# Patient Record
Sex: Female | Born: 1955 | Race: White | Hispanic: No | Marital: Single | Smoking: Current every day smoker
Health system: Southern US, Community
[De-identification: ages and names within clinical notes are randomized; demographics above are authoritative.]

## PROBLEM LIST (undated history)

## (undated) DIAGNOSIS — I509 Heart failure, unspecified: Secondary | ICD-10-CM

## (undated) DIAGNOSIS — J449 Chronic obstructive pulmonary disease, unspecified: Secondary | ICD-10-CM

## (undated) DIAGNOSIS — I10 Essential (primary) hypertension: Secondary | ICD-10-CM

## (undated) HISTORY — DX: Chronic obstructive pulmonary disease, unspecified: J44.9

## (undated) HISTORY — PX: STOMACH SURGERY: SHX791

## (undated) HISTORY — PX: APPENDECTOMY: SHX54

## (undated) HISTORY — DX: Heart failure, unspecified: I50.9

## (undated) HISTORY — DX: Essential (primary) hypertension: I10

---

## 2019-05-22 DIAGNOSIS — I340 Nonrheumatic mitral (valve) insufficiency: Secondary | ICD-10-CM

## 2019-05-22 DIAGNOSIS — I10 Essential (primary) hypertension: Secondary | ICD-10-CM

## 2019-05-22 DIAGNOSIS — I361 Nonrheumatic tricuspid (valve) insufficiency: Secondary | ICD-10-CM

## 2019-05-23 DIAGNOSIS — I10 Essential (primary) hypertension: Secondary | ICD-10-CM

## 2019-05-23 DIAGNOSIS — K255 Chronic or unspecified gastric ulcer with perforation: Secondary | ICD-10-CM

## 2019-05-23 DIAGNOSIS — I429 Cardiomyopathy, unspecified: Secondary | ICD-10-CM

## 2019-05-23 DIAGNOSIS — R7989 Other specified abnormal findings of blood chemistry: Secondary | ICD-10-CM

## 2019-05-25 DIAGNOSIS — I10 Essential (primary) hypertension: Secondary | ICD-10-CM

## 2019-05-25 DIAGNOSIS — R7989 Other specified abnormal findings of blood chemistry: Secondary | ICD-10-CM

## 2019-05-25 DIAGNOSIS — K255 Chronic or unspecified gastric ulcer with perforation: Secondary | ICD-10-CM

## 2019-05-25 DIAGNOSIS — I471 Supraventricular tachycardia: Secondary | ICD-10-CM

## 2019-05-25 DIAGNOSIS — I429 Cardiomyopathy, unspecified: Secondary | ICD-10-CM

## 2019-05-25 DIAGNOSIS — I447 Left bundle-branch block, unspecified: Secondary | ICD-10-CM

## 2019-06-05 DIAGNOSIS — Z09 Encounter for follow-up examination after completed treatment for conditions other than malignant neoplasm: Secondary | ICD-10-CM

## 2019-06-05 DIAGNOSIS — K251 Acute gastric ulcer with perforation: Secondary | ICD-10-CM

## 2019-06-05 HISTORY — DX: Encounter for follow-up examination after completed treatment for conditions other than malignant neoplasm: Z09

## 2019-06-05 HISTORY — DX: Acute gastric ulcer with perforation: K25.1

## 2019-11-04 DIAGNOSIS — I340 Nonrheumatic mitral (valve) insufficiency: Secondary | ICD-10-CM

## 2019-11-04 DIAGNOSIS — J441 Chronic obstructive pulmonary disease with (acute) exacerbation: Secondary | ICD-10-CM

## 2019-11-04 DIAGNOSIS — I361 Nonrheumatic tricuspid (valve) insufficiency: Secondary | ICD-10-CM

## 2019-11-04 DIAGNOSIS — I5023 Acute on chronic systolic (congestive) heart failure: Secondary | ICD-10-CM

## 2019-11-04 DIAGNOSIS — I10 Essential (primary) hypertension: Secondary | ICD-10-CM

## 2019-11-04 DIAGNOSIS — R0600 Dyspnea, unspecified: Secondary | ICD-10-CM

## 2019-12-25 DIAGNOSIS — Z1211 Encounter for screening for malignant neoplasm of colon: Secondary | ICD-10-CM

## 2019-12-25 HISTORY — DX: Encounter for screening for malignant neoplasm of colon: Z12.11

## 2019-12-26 ENCOUNTER — Telehealth: Payer: Self-pay | Admitting: *Deleted

## 2019-12-26 NOTE — Telephone Encounter (Signed)
Our office has received a referral from [facility] for cardiac evaluation due to dx CHF. Notes have been sent to our office by Dr. Clayburn Pert Lininger (General Surgery). Pt is needing surgery and will need cardiac clearance. Pt has upcoming New Pt appt Berniece Salines, DO on May 3rd, 2021. I will place a surgical request and will return the notes our office received to our Chart Prep team Guinevere Ferrari, [place]) to hold until appt 01/14/20.     [facility] Pre-operative Risk Assessment    Request for surgical clearance: PT HAS NEW PT APPT WITH KARDIE TOBB, DO 01/14/20  1. What type of surgery is being performed? EGD/COLONOSCOPY   2. When is this surgery scheduled? TBD   3. What type of clearance is required (medical clearance vs. Pharmacy clearance to hold med vs. Both)? MEDICAL  4. Are there any medications that need to be held prior to surgery and how long? NO MEDS LISTED   5. Practice name and name of physician performing surgery? [facility] SURGICAL SPECIALIST; DR. Legrand Como DAVID Noberto Retort   6. What is your office phone number [phone]    7.   What is your office fax number [phone]  8.   Anesthesia type (None, local, MAC, general) ? STATES ANESTHESIA BUT DOES NOT LIST WHAT TYPE OF ANESTHESIA.    Julaine Hua 12/26/2019, 5:29 PM  _________________________________________________________________   (provider comments below)

## 2020-01-14 ENCOUNTER — Ambulatory Visit: Payer: Self-pay | Admitting: Cardiology

## 2020-01-17 ENCOUNTER — Encounter: Payer: Self-pay | Admitting: *Deleted

## 2020-01-17 ENCOUNTER — Encounter: Payer: Self-pay | Admitting: Cardiology

## 2020-01-17 DIAGNOSIS — I10 Essential (primary) hypertension: Secondary | ICD-10-CM | POA: Insufficient documentation

## 2020-01-18 ENCOUNTER — Ambulatory Visit (INDEPENDENT_AMBULATORY_CARE_PROVIDER_SITE_OTHER): Payer: Medicaid Other

## 2020-01-18 ENCOUNTER — Encounter: Payer: Self-pay | Admitting: Cardiology

## 2020-01-18 ENCOUNTER — Ambulatory Visit (INDEPENDENT_AMBULATORY_CARE_PROVIDER_SITE_OTHER): Payer: Medicaid Other | Admitting: Cardiology

## 2020-01-18 ENCOUNTER — Other Ambulatory Visit: Payer: Self-pay

## 2020-01-18 VITALS — BP 140/81 | HR 63 | Ht 63.0 in | Wt 136.0 lb

## 2020-01-18 DIAGNOSIS — I10 Essential (primary) hypertension: Secondary | ICD-10-CM | POA: Insufficient documentation

## 2020-01-18 DIAGNOSIS — F17200 Nicotine dependence, unspecified, uncomplicated: Secondary | ICD-10-CM

## 2020-01-18 DIAGNOSIS — R0789 Other chest pain: Secondary | ICD-10-CM | POA: Insufficient documentation

## 2020-01-18 DIAGNOSIS — Z01818 Encounter for other preprocedural examination: Secondary | ICD-10-CM

## 2020-01-18 DIAGNOSIS — E782 Mixed hyperlipidemia: Secondary | ICD-10-CM

## 2020-01-18 DIAGNOSIS — I493 Ventricular premature depolarization: Secondary | ICD-10-CM

## 2020-01-18 HISTORY — DX: Essential (primary) hypertension: I10

## 2020-01-18 HISTORY — DX: Encounter for other preprocedural examination: Z01.818

## 2020-01-18 HISTORY — DX: Other chest pain: R07.89

## 2020-01-18 HISTORY — DX: Ventricular premature depolarization: I49.3

## 2020-01-18 HISTORY — DX: Nicotine dependence, unspecified, uncomplicated: F17.200

## 2020-01-18 HISTORY — DX: Mixed hyperlipidemia: E78.2

## 2020-01-18 MED ORDER — CARVEDILOL 6.25 MG PO TABS
6.2500 mg | ORAL_TABLET | Freq: Two times a day (BID) | ORAL | 3 refills | Status: DC
Start: 2020-01-18 — End: 2021-01-02

## 2020-01-18 MED ORDER — NITROGLYCERIN 0.4 MG SL SUBL
0.4000 mg | SUBLINGUAL_TABLET | SUBLINGUAL | 3 refills | Status: DC | PRN
Start: 2020-01-18 — End: 2021-01-02

## 2020-01-18 NOTE — Progress Notes (Addendum)
Cardiology Office Note:    Date:  01/18/2020   ID:  Taylor Paul, DOB Jun 26, 1956, MRN [id number]  PCP:  Maylon Cos, NP  Cardiologist:  Berniece Salines, DO  Electrophysiologist:  None   Referring MD: Marylu Lund., MD   Chief Complaint  Patient presents with  . Pre-op Exam    colonoscopy and check gastric ulcer   History of Present Illness:    Taylor Paul is a 64 y.o. female with a hx of depressed ejection fraction EF back in February at [facility] was 40 to 45%-unclear etiology of her cardiomyopathy, hypertension, hyperlipidemia, smoker, family history of coronary artery disease presents today to be evaluated preoperatively for her EGD/colonoscopy.  Of note in September the patient underwent an open surgery due to perforated gastric ulcer.  She did well with the surgery from a cardiovascular standpoint and now needs screening colonoscopy as she has never had one.  The patient does tell me that she has been experiencing some left-sided chest pain.  She described as oppressive pressure-like sensation which at times it feels more of a tightness. She states that when this comes on it last for about 30 minutes.   Today she is not experiencing chest pain today.    Past Medical History:  Diagnosis Date  . Acute gastric ulcer with perforation ([place]) 06/05/2019  . Congestive heart disease ([place])   . COPD (chronic obstructive pulmonary disease) ([place])   . Hypertension     Past Surgical History:  Procedure Laterality Date  . APPENDECTOMY    . STOMACH SURGERY     Perforated gastric ulcer    Current Medications: Current Meds  Medication Sig  . atorvastatin (LIPITOR) 20 MG tablet 20 mg daily.  . furosemide (LASIX) 20 MG tablet Take 20 mg by mouth daily.  Marland Kitchen labetalol (NORMODYNE) 200 MG tablet 100 mg daily.  Marland Kitchen lisinopril (ZESTRIL) 20 MG tablet Take 20 mg by mouth daily.     Allergies:   Amoxicillin   Social History   Socioeconomic History  . Marital status:  Unknown    Spouse name: Not on file  . Number of children: Not on file  . Years of education: Not on file  . Highest education level: Not on file  Occupational History  . Not on file  Tobacco Use  . Smoking status: Current Every Day Smoker  . Smokeless tobacco: Never Used  Substance and Sexual Activity  . Alcohol use: Never  . Drug use: Never  . Sexual activity: Not on file  Other Topics Concern  . Not on file  Social History Narrative  . Not on file   Social Determinants of Health   Financial Resource Strain:   . Difficulty of Paying Living Expenses:   Food Insecurity:   . Worried About Charity fundraiser in the Last Year:   . Arboriculturist in the Last Year:   Transportation Needs:   . Film/video editor (Medical):   Marland Kitchen Lack of Transportation (Non-Medical):   Physical Activity:   . Days of Exercise per Week:   . Minutes of Exercise per Session:   Stress:   . Feeling of Stress :   Social Connections:   . Frequency of Communication with Friends and Family:   . Frequency of Social Gatherings with Friends and Family:   . Attends Religious Services:   . Active Member of Clubs or Organizations:   . Attends Archivist Meetings:   Marland Kitchen Marital  Status:      Family History: The patient's family history includes Congestive Heart Failure in her father; Diabetes in her brother, father, and mother; Heart disease in her brother; Hypertension in her brother, father, and mother; Stroke in her brother.  ROS:   Review of Systems  Constitution: Negative for decreased appetite, fever and weight gain.  HENT: Negative for congestion, ear discharge, hoarse voice and sore throat.   Eyes: Negative for discharge, redness, vision loss in right eye and visual halos.  Cardiovascular: Reports chest pain. Negative for dyspnea on exertion, leg swelling, orthopnea and palpitations.  Respiratory: Negative for cough, hemoptysis, shortness of breath and snoring.   Endocrine: Negative  for heat intolerance and polyphagia.  Hematologic/Lymphatic: Negative for bleeding problem. Does not bruise/bleed easily.  Skin: Negative for flushing, nail changes, rash and suspicious lesions.  Musculoskeletal: Negative for arthritis, joint pain, muscle cramps, myalgias, neck pain and stiffness.  Gastrointestinal: Negative for abdominal pain, bowel incontinence, diarrhea and excessive appetite.  Genitourinary: Negative for decreased libido, genital sores and incomplete emptying.  Neurological: Negative for brief paralysis, focal weakness, headaches and loss of balance.  Psychiatric/Behavioral: Negative for altered mental status, depression and suicidal ideas.  Allergic/Immunologic: Negative for HIV exposure and persistent infections.    EKGs/Labs/Other Studies Reviewed:    The following studies were reviewed today:   EKG:  The ekg ordered today demonstrates sinus rhythm, heart rate 74 bpm with frequent PVCs and PACs.  Echocardiogram done at [facility] in February 2021 shows mild to moderate depressed ejection fraction 40 to 45%.  Grade 1 diastolic dysfunction.  Mild aortic sclerosis.  Trace aortic regurgitation.  Mild mitral regurgitation.  Trace tricuspid regurgitation.  Recent Labs: No results found for requested labs within last 8760 hours.  Recent Lipid Panel No results found for: CHOL, TRIG, HDL, CHOLHDL, VLDL, LDLCALC, LDLDIRECT  Physical Exam:    VS:  BP 140/81 (BP Location: Right Arm, Patient Position: Sitting, Cuff Size: Normal)   Pulse 63   Ht 5\' 3"  (1.6 m)   Wt 136 lb (61.7 kg)   SpO2 96%   BMI 24.09 kg/m     Wt Readings from Last 3 Encounters:  01/18/20 136 lb (61.7 kg)     GEN: Well nourished, well developed in no acute distress HEENT: Normal NECK: No JVD; No carotid bruits LYMPHATICS: No lymphadenopathy CARDIAC: S1S2 noted,RRR, no murmurs, rubs, gallops RESPIRATORY:  Clear to auscultation without rales, wheezing or rhonchi  ABDOMEN: Soft,  non-tender, non-distended, +bowel sounds, no guarding. EXTREMITIES: No edema, No cyanosis, no clubbing MUSCULOSKELETAL:  No deformity  SKIN: Warm and dry NEUROLOGIC:  Alert and oriented x 3, non-focal PSYCHIATRIC:  Normal affect, good insight  ASSESSMENT:    1. Preoperative clearance   2. Other chest pain   3. PVC (premature ventricular contraction)   4. Essential hypertension   5. Mixed hyperlipidemia   6. Smoker    PLAN:    In terms of her clearance for EGD/colonoscopy I think the patient can undergo her procedure.  Continue hemodynamic monitoring closely during her procedure.  In terms of her chest pain further work-up needs to be done which does not need to be done before her procedure as I do think that she can go ahead with her endoscopy.  But I educated her about the pharmacologic nuclear stress test due to her intermittent chest pain.  She is willing to proceed with this testing as well.  In the meantime sublingual nitroglycerin prescription was sent, its protocol and  911 protocol explained and the patient vocalized understanding questions were answered to the patient's satisfaction.  In terms of her depressed ejection fraction I am going to optimize her medication to get her towards heart failure medicines therefore I will stop the labetalol, and start carvedilol 6.25 mg twice daily.  She will maintain her lisinopril milligrams daily.  Keep the patient on Lasix 20 mg daily we will also send potassium supplements.  Blood work will be done today for BMP and mag.  EKG is concerning for pre and PVC-I placed a monitor on the patient for 14 days to assess the burden of PVC.  She is a smoker we discussed smoking cessation-she is not ready to quit smoking yet.  Hyperlipidemia continue the patient on her Lipitor 20 mg daily.  The patient is in agreement with the above plan. The patient left the office in stable condition.  The patient will follow up in 1 month or sooner if  needed.   Medication Adjustments/Labs and Tests Ordered: Current medicines are reviewed at length with the patient today.  Concerns regarding medicines are outlined above.  Orders Placed This Encounter  Procedures  . Basic metabolic panel  . Magnesium  . MYOCARDIAL PERFUSION IMAGING  . LONG TERM MONITOR (3-14 DAYS)  . EKG 12-Lead   Meds ordered this encounter  Medications  . carvedilol (COREG) 6.25 MG tablet    Sig: Take 1 tablet (6.25 mg total) by mouth 2 (two) times daily.    Dispense:  180 tablet    Refill:  3  . nitroGLYCERIN (NITROSTAT) 0.4 MG SL tablet    Sig: Place 1 tablet (0.4 mg total) under the tongue every 5 (five) minutes as needed for chest pain.    Dispense:  25 tablet    Refill:  3    Patient Instructions  Medication Instructions:  1) Start Carvedilol (Coreg) 6.25 mg twice a day   2) Start Nitroglycerin 0.4 mg every 5 minutes as needed for chest pain   *If you need a refill on your cardiac medications before your next appointment, please call your pharmacy*   Lab Work: Bmet & Mag- Today   If you have labs (blood work) drawn today and your tests are completely normal, you will receive your results only by: Marland Kitchen MyChart Message (if you have MyChart) OR . A paper copy in the mail If you have any lab test that is abnormal or we need to change your treatment, we will call you to review the results.   Testing/Procedures: Your physician has requested that you have a lexiscan myoview. For further information please visit [email or web address] Please follow instruction sheet, as given.  A zio monitor was ordered today. It will remain on for 14 days. You will then return monitor and event diary in provided box. It takes 1-2 weeks for report to be downloaded and returned to [place]. We will call you with the results. If monitor falls off or has orange flashing light, please call Zio for further instructions.     Follow-Up: At [facility], you and your health needs  are our priority.  As part of our continuing mission to provide you with exceptional heart care, we have created designated Provider Care Teams.  These Care Teams include your primary Cardiologist (physician) and Advanced Practice Providers (APPs -  Physician Assistants and Nurse Practitioners) who all work together to provide you with the care you need, when you need it.  We recommend signing up for the patient portal called "  MyChart".  Sign up information is provided on this After Visit Summary.  MyChart is used to connect with patients for Virtual Visits (Telemedicine).  Patients are able to view lab/test results, encounter notes, upcoming appointments, etc.  Non-urgent messages can be sent to your provider as well.   To learn more about what you can do with MyChart, go to [email or web address].    Your next appointment:   1 month(s)  The format for your next appointment:   In Person  Provider:   Thomasene Ripple, DO   Other Instructions None      Adopting a Healthy Lifestyle.  Know what a healthy weight is for you (roughly BMI <25) and aim to maintain this   Aim for 7+ servings of fruits and vegetables daily   65-80+ fluid ounces of water or unsweet tea for healthy kidneys   Limit to max 1 drink of alcohol per day; avoid smoking/tobacco   Limit animal fats in diet for cholesterol and heart health - choose grass fed whenever available   Avoid highly processed foods, and foods high in saturated/trans fats   Aim for low stress - take time to unwind and care for your mental health   Aim for 150 min of moderate intensity exercise weekly for heart health, and weights twice weekly for bone health   Aim for 7-9 hours of sleep daily   When it comes to diets, agreement about the perfect plan isnt easy to find, even among the experts. Experts at the [facility] [employer] developed an idea known as the Healthy Eating Plate. Just imagine a plate divided into logical, healthy  portions.   The emphasis is on diet quality:   Load up on vegetables and fruits - one-half of your plate: Aim for color and variety, and remember that potatoes dont count.   Go for whole grains - one-quarter of your plate: Whole wheat, barley, wheat berries, quinoa, oats, brown rice, and foods made with them. If you want pasta, go with whole wheat pasta.   Protein power - one-quarter of your plate: Fish, chicken, beans, and nuts are all healthy, versatile protein sources. Limit red meat.   The diet, however, does go beyond the plate, offering a few other suggestions.   Use healthy plant oils, such as olive, canola, soy, corn, sunflower and peanut. Check the labels, and avoid partially hydrogenated oil, which have unhealthy trans fats.   If youre thirsty, drink water. Coffee and tea are good in moderation, but skip sugary drinks and limit milk and dairy products to one or two daily servings.   The type of carbohydrate in the diet is more important than the amount. Some sources of carbohydrates, such as vegetables, fruits, whole grains, and beans-are healthier than others.   Finally, stay active  Signed, Thomasene Ripple, DO  01/18/2020 1:16 PM    [facility]

## 2020-01-18 NOTE — Patient Instructions (Addendum)
Medication Instructions:  1) Start Carvedilol (Coreg) 6.25 mg twice a day   2) Start Nitroglycerin 0.4 mg every 5 minutes as needed for chest pain   *If you need a refill on your cardiac medications before your next appointment, please call your pharmacy*   Lab Work: Bmet & Mag- Today   If you have labs (blood work) drawn today and your tests are completely normal, you will receive your results only by: Marland Kitchen MyChart Message (if you have MyChart) OR . A paper copy in the mail If you have any lab test that is abnormal or we need to change your treatment, we will call you to review the results.   Testing/Procedures: Your physician has requested that you have a lexiscan myoview. For further information please visit [email or web address] Please follow instruction sheet, as given.  A zio monitor was ordered today. It will remain on for 14 days. You will then return monitor and event diary in provided box. It takes 1-2 weeks for report to be downloaded and returned to [place]. We will call you with the results. If monitor falls off or has orange flashing light, please call Zio for further instructions.     Follow-Up: At [facility], you and your health needs are our priority.  As part of our continuing mission to provide you with exceptional heart care, we have created designated Provider Care Teams.  These Care Teams include your primary Cardiologist (physician) and Advanced Practice Providers (APPs -  Physician Assistants and Nurse Practitioners) who all work together to provide you with the care you need, when you need it.  We recommend signing up for the patient portal called "MyChart".  Sign up information is provided on this After Visit Summary.  MyChart is used to connect with patients for Virtual Visits (Telemedicine).  Patients are able to view lab/test results, encounter notes, upcoming appointments, etc.  Non-urgent messages can be sent to your provider as well.   To learn more about what  you can do with MyChart, go to [email or web address].    Your next appointment:   1 month(s)  The format for your next appointment:   In Person  Provider:   Thomasene Ripple, DO   Other Instructions None

## 2020-01-19 LAB — BASIC METABOLIC PANEL
BUN/Creatinine Ratio: 19 (ref 12–28)
BUN: 28 mg/dL — ABNORMAL HIGH (ref 8–27)
CO2: 18 mmol/L — ABNORMAL LOW (ref 20–29)
Calcium: 9.2 mg/dL (ref 8.7–10.3)
Chloride: 110 mmol/L — ABNORMAL HIGH (ref 96–106)
Creatinine, Ser: 1.48 mg/dL — ABNORMAL HIGH (ref 0.57–1.00)
GFR calc Af Amer: 44 mL/min/{1.73_m2} — ABNORMAL LOW (ref >59)
GFR calc non Af Amer: 38 mL/min/{1.73_m2} — ABNORMAL LOW (ref >59)
Glucose: 66 mg/dL (ref 65–99)
Potassium: 4 mmol/L (ref 3.5–5.2)
Sodium: 145 mmol/L — ABNORMAL HIGH (ref 134–144)

## 2020-01-19 LAB — MAGNESIUM: Magnesium: 2.3 mg/dL (ref 1.6–2.3)

## 2020-01-21 ENCOUNTER — Telehealth: Payer: Self-pay

## 2020-01-21 NOTE — Telephone Encounter (Signed)
Spoke with patient regarding results and recommendation.  Patient verbalizes understanding and is agreeable to plan of care. Advised patient to call back with any issues or concerns.  

## 2020-01-21 NOTE — Telephone Encounter (Signed)
Left message on patients voicemail to please return our call.   

## 2020-01-21 NOTE — Telephone Encounter (Signed)
-----   Message from Thomasene Ripple, DO sent at 01/19/2020  2:12 PM EDT ----- Your creatinine is elevated at 1.48, I have to compare with the previous creatinine.  We will continue the Lasix for now

## 2020-01-22 ENCOUNTER — Telehealth: Payer: Self-pay | Admitting: *Deleted

## 2020-01-22 NOTE — Telephone Encounter (Signed)
Patient given detailed instructions per Myocardial Perfusion Study Information Sheet for the test on 01/29/2020 at 1100. Patient notified to arrive 15 minutes early and that it is imperative to arrive on time for appointment to keep from having the test rescheduled.  If you need to cancel or reschedule your appointment, please call the office within 24 hours of your appointment. . Patient verbalized understanding.Taylor Paul, Taylor Paul No mychart available

## 2020-01-29 ENCOUNTER — Other Ambulatory Visit: Payer: Self-pay

## 2020-01-29 ENCOUNTER — Ambulatory Visit (INDEPENDENT_AMBULATORY_CARE_PROVIDER_SITE_OTHER): Payer: Medicaid Other

## 2020-01-29 DIAGNOSIS — R0789 Other chest pain: Secondary | ICD-10-CM

## 2020-01-29 LAB — MYOCARDIAL PERFUSION IMAGING
LV dias vol: 87 mL (ref 46–106)
LV sys vol: 48 mL
Peak HR: 101 {beats}/min
Rest HR: 75 {beats}/min
SDS: 3
SRS: 0
SSS: 3
TID: 0.9

## 2020-01-29 MED ORDER — REGADENOSON 0.4 MG/5ML IV SOLN
0.4000 mg | Freq: Once | INTRAVENOUS | Status: AC
  Administered 2020-01-29: 0.4 mg via INTRAVENOUS

## 2020-01-29 MED ORDER — TECHNETIUM TC 99M TETROFOSMIN IV KIT
9.6000 | PACK | Freq: Once | INTRAVENOUS | Status: AC | PRN
Start: 2020-01-29 — End: 2020-01-29
  Administered 2020-01-29: 9.6 via INTRAVENOUS

## 2020-01-29 MED ORDER — TECHNETIUM TC 99M TETROFOSMIN IV KIT
32.7000 | PACK | Freq: Once | INTRAVENOUS | Status: AC | PRN
  Administered 2020-01-29: 32.7 via INTRAVENOUS

## 2020-02-08 ENCOUNTER — Telehealth: Payer: Self-pay | Admitting: Cardiology

## 2020-02-08 NOTE — Telephone Encounter (Signed)
Accessed pt's chart to see who called her. Reached out to [place] via secure chat and then transferred the call.

## 2020-02-18 ENCOUNTER — Other Ambulatory Visit: Payer: Self-pay

## 2020-02-18 ENCOUNTER — Encounter: Payer: Self-pay | Admitting: Cardiology

## 2020-02-18 ENCOUNTER — Ambulatory Visit (INDEPENDENT_AMBULATORY_CARE_PROVIDER_SITE_OTHER): Payer: Medicaid Other | Admitting: Cardiology

## 2020-02-18 VITALS — BP 102/58 | HR 76 | Ht 63.0 in | Wt 137.0 lb

## 2020-02-18 DIAGNOSIS — R931 Abnormal findings on diagnostic imaging of heart and coronary circulation: Secondary | ICD-10-CM

## 2020-02-18 DIAGNOSIS — I10 Essential (primary) hypertension: Secondary | ICD-10-CM

## 2020-02-18 DIAGNOSIS — E782 Mixed hyperlipidemia: Secondary | ICD-10-CM

## 2020-02-18 DIAGNOSIS — R0789 Other chest pain: Secondary | ICD-10-CM | POA: Diagnosis not present

## 2020-02-18 DIAGNOSIS — R0989 Other specified symptoms and signs involving the circulatory and respiratory systems: Secondary | ICD-10-CM

## 2020-02-18 DIAGNOSIS — F17200 Nicotine dependence, unspecified, uncomplicated: Secondary | ICD-10-CM

## 2020-02-18 DIAGNOSIS — I493 Ventricular premature depolarization: Secondary | ICD-10-CM

## 2020-02-18 HISTORY — DX: Abnormal findings on diagnostic imaging of heart and coronary circulation: R93.1

## 2020-02-18 HISTORY — DX: Other specified symptoms and signs involving the circulatory and respiratory systems: R09.89

## 2020-02-18 NOTE — Progress Notes (Signed)
Cardiology Office Note:    Date:  02/18/2020   ID:  JAHLIA OMURA, DOB 12/15/1955, MRN [id number]  PCP:  Maylon Cos, NP  Cardiologist:  Berniece Salines, DO  Electrophysiologist:  None   Referring MD: Maylon Cos, NP   " I am doing ok"  History of Present Illness:    Taylor Paul is a 64 y.o. female with a hx of heart failure with depressed ejection fraction 40 to 45%, hypertension, hyperlipidemia, smoker, family history is presented initially on Jan 18, 2020 preoperative evaluation for EGD/colonoscopy.  At the conclusion of that visit due to intermittent chest pain a nuclear stress test was ordered.  In addition PVCs were noted on her EKG as well as she reported palpitation therefore a monitor was placed on the patient.  She is here for follow-up visit she tells me that she has some days when she experienced chest pain but the nitro has helped resolve the.  Is looking forward to her endoscopy/colonoscopy on Friday.  Past Medical History:  Diagnosis Date  . Acute gastric ulcer with perforation ([place]) 06/05/2019  . Congestive heart disease ([place])   . COPD (chronic obstructive pulmonary disease) ([place])   . Encounter for screening colonoscopy 12/25/2019  . Essential hypertension 01/18/2020  . Hypertension   . Mixed hyperlipidemia 01/18/2020  . Other chest pain 01/18/2020  . Postoperative examination 06/05/2019  . Preoperative clearance 01/18/2020  . PVC (premature ventricular contraction) 01/18/2020  . Smoker 01/18/2020    Past Surgical History:  Procedure Laterality Date  . APPENDECTOMY    . STOMACH SURGERY     Perforated gastric ulcer    Current Medications: Current Meds  Medication Sig  . atorvastatin (LIPITOR) 20 MG tablet 20 mg daily.  . carvedilol (COREG) 6.25 MG tablet Take 1 tablet (6.25 mg total) by mouth 2 (two) times daily.  . furosemide (LASIX) 20 MG tablet Take 20 mg by mouth daily.  Marland Kitchen lisinopril (ZESTRIL) 20 MG tablet Take 20 mg by mouth daily.  . nitroGLYCERIN  (NITROSTAT) 0.4 MG SL tablet Place 1 tablet (0.4 mg total) under the tongue every 5 (five) minutes as needed for chest pain.  . [DISCONTINUED] labetalol (NORMODYNE) 200 MG tablet 100 mg daily.     Allergies:   Amoxicillin   Social History   Socioeconomic History  . Marital status: Unknown    Spouse name: Not on file  . Number of children: Not on file  . Years of education: Not on file  . Highest education level: Not on file  Occupational History  . Not on file  Tobacco Use  . Smoking status: Current Every Day Smoker  . Smokeless tobacco: Never Used  Substance and Sexual Activity  . Alcohol use: Never  . Drug use: Never  . Sexual activity: Not on file  Other Topics Concern  . Not on file  Social History Narrative  . Not on file   Social Determinants of Health   Financial Resource Strain:   . Difficulty of Paying Living Expenses:   Food Insecurity:   . Worried About Charity fundraiser in the Last Year:   . Arboriculturist in the Last Year:   Transportation Needs:   . Film/video editor (Medical):   Marland Kitchen Lack of Transportation (Non-Medical):   Physical Activity:   . Days of Exercise per Week:   . Minutes of Exercise per Session:   Stress:   . Feeling of Stress :   Social Connections:   .  Frequency of Communication with Friends and Family:   . Frequency of Social Gatherings with Friends and Family:   . Attends Religious Services:   . Active Member of Clubs or Organizations:   . Attends Banker Meetings:   Marland Kitchen Marital Status:      Family History: The patient's family history includes Congestive Heart Failure in her father; Diabetes in her brother, father, and mother; Heart disease in her brother; Hypertension in her brother, father, and mother; Stroke in her brother.  ROS:   Review of Systems  Constitution: Negative for decreased appetite, fever and weight gain.  HENT: Negative for congestion, ear discharge, hoarse voice and sore throat.   Eyes:  Negative for discharge, redness, vision loss in right eye and visual halos.  Cardiovascular: Reports chest pain, negative for dyspnea on exertion, leg swelling, orthopnea and palpitations.  Respiratory: Negative for cough, hemoptysis, shortness of breath and snoring.   Endocrine: Negative for heat intolerance and polyphagia.  Hematologic/Lymphatic: Negative for bleeding problem. Does not bruise/bleed easily.  Skin: Negative for flushing, nail changes, rash and suspicious lesions.  Musculoskeletal: Negative for arthritis, joint pain, muscle cramps, myalgias, neck pain and stiffness.  Gastrointestinal: Negative for abdominal pain, bowel incontinence, diarrhea and excessive appetite.  Genitourinary: Negative for decreased libido, genital sores and incomplete emptying.  Neurological: Negative for brief paralysis, focal weakness, headaches and loss of balance.  Psychiatric/Behavioral: Negative for altered mental status, depression and suicidal ideas.  Allergic/Immunologic: Negative for HIV exposure and persistent infections.    EKGs/Labs/Other Studies Reviewed:    The following studies were reviewed today:   EKG: None today  Pharmacologic nuclear stress test  The left ventricular ejection fraction is mildly decreased (45-54%).  Nuclear stress EF: 45%.  There was no ST segment deviation noted during stress.  No evidence of ischemia. Soft tissue attenuation in the inferior segment of the LV.  E[facility] done at [facility] in February 2021 shows mild to moderate depressed ejection fraction 40 to 45%.  Grade 1 diastolic dysfunction.  Mild aortic sclerosis.  Trace aortic regurgitation.  Mild mitral regurgitation.  Trace tricuspid regurgitation.   Recent Labs: 01/18/2020: BUN 28; Creatinine, Ser 1.48; Magnesium 2.3; Potassium 4.0; Sodium 145  Recent Lipid Panel No results found for: CHOL, TRIG, HDL, CHOLHDL, VLDL, LDLCALC, LDLDIRECT  Physical Exam:    VS:  BP (!) 102/58    Pulse 76   Ht 5\' 3"  (1.6 m)   Wt 137 lb (62.1 kg)   SpO2 96%   BMI 24.27 kg/m     Wt Readings from Last 3 Encounters:  02/18/20 137 lb (62.1 kg)  01/29/20 136 lb (61.7 kg)  01/18/20 136 lb (61.7 kg)     GEN: Well nourished, well developed in no acute distress HEENT: Normal NECK: No JVD; No carotid bruits LYMPHATICS: No lymphadenopathy CARDIAC: S1S2 noted,RRR, no murmurs, rubs, gallops RESPIRATORY:  Clear to auscultation without rales, wheezing or rhonchi  ABDOMEN: Soft, non-tender, non-distended, +bowel sounds, no guarding. EXTREMITIES: No edema, No cyanosis, no clubbing MUSCULOSKELETAL:  No deformity  SKIN: Warm and dry NEUROLOGIC:  Alert and oriented x 3, non-focal PSYCHIATRIC:  Normal affect, good insight  ASSESSMENT:    1. Essential hypertension   2. PVC (premature ventricular contraction)   3. Other chest pain   4. Mixed hyperlipidemia   5. Smoker   6. Depressed left ventricular ejection fraction    PLAN:     Her blood pressures acceptable in the office today.  She tells me that  she is lower at times in the low 100s at her systolic blood pressure.  Today I have the patient take Lasix on a as needed basis as she is euvolemic in the office today.  I was able to discuss her nuclear stress test results with her which showed no evidence of ischemia.  She is still having some intermittent chest pain that has been resolved by nitroglycerin we will continue to monitor the patient if needed for further ischemic evaluation with another specific testing.  For depressed ejection fraction continue patient on carvedilol 6.25 mg twice a day as well as lisinopril 20 mg daily.  Hyperlipidemia continue patient on her 20 mg Lipitor.  PVC-symptoms has improved still waiting for her monitor results.  Smoker-she is  not ready yet to quit smoking.  She has had thinking about it.  Patient should be able to proceed with her endoscopy.  The patient is in agreement with the above plan.  The patient left the office in stable condition.  The patient will follow up in   Medication Adjustments/Labs and Tests Ordered: Current medicines are reviewed at length with the patient today.  Concerns regarding medicines are outlined above.  No orders of the defined types were placed in this encounter.  No orders of the defined types were placed in this encounter.   There are no Patient Instructions on file for this visit.   Adopting a Healthy Lifestyle.  Know what a healthy weight is for you (roughly BMI <25) and aim to maintain this   Aim for 7+ servings of fruits and vegetables daily   65-80+ fluid ounces of water or unsweet tea for healthy kidneys   Limit to max 1 drink of alcohol per day; avoid smoking/tobacco   Limit animal fats in diet for cholesterol and heart health - choose grass fed whenever available   Avoid highly processed foods, and foods high in saturated/trans fats   Aim for low stress - take time to unwind and care for your mental health   Aim for 150 min of moderate intensity exercise weekly for heart health, and weights twice weekly for bone health   Aim for 7-9 hours of sleep daily   When it comes to diets, agreement about the perfect plan isnt easy to find, even among the experts. Experts at the [facility] [employer] developed an idea known as the Healthy Eating Plate. Just imagine a plate divided into logical, healthy portions.   The emphasis is on diet quality:   Load up on vegetables and fruits - one-half of your plate: Aim for color and variety, and remember that potatoes dont count.   Go for whole grains - one-quarter of your plate: Whole wheat, barley, wheat berries, quinoa, oats, brown rice, and foods made with them. If you want pasta, go with whole wheat pasta.   Protein power - one-quarter of your plate: Fish, chicken, beans, and nuts are all healthy, versatile protein sources. Limit red meat.   The diet, however, does go beyond  the plate, offering a few other suggestions.   Use healthy plant oils, such as olive, canola, soy, corn, sunflower and peanut. Check the labels, and avoid partially hydrogenated oil, which have unhealthy trans fats.   If youre thirsty, drink water. Coffee and tea are good in moderation, but skip sugary drinks and limit milk and dairy products to one or two daily servings.   The type of carbohydrate in the diet is more important than the amount.  Some sources of carbohydrates, such as vegetables, fruits, whole grains, and beans-are healthier than others.   Finally, stay active  Signed, Thomasene Ripple, DO  02/18/2020 11:54 AM     [facility]

## 2020-02-18 NOTE — Patient Instructions (Signed)
Medication Instructions:  Your physician has recommended you make the following change in your medication:   Take Lasix 20 mg as needed.  *If you need a refill on your cardiac medications before your next appointment, please call your pharmacy*   Lab Work: None ordered If you have labs (blood work) drawn today and your tests are completely normal, you will receive your results only by: Marland Kitchen MyChart Message (if you have MyChart) OR . A paper copy in the mail If you have any lab test that is abnormal or we need to change your treatment, we will call you to review the results.   Testing/Procedures: None ordered   Follow-Up: At [facility], you and your health needs are our priority.  As part of our continuing mission to provide you with exceptional heart care, we have created designated Provider Care Teams.  These Care Teams include your primary Cardiologist (physician) and Advanced Practice Providers (APPs -  Physician Assistants and Nurse Practitioners) who all work together to provide you with the care you need, when you need it.  We recommend signing up for the patient portal called "MyChart".  Sign up information is provided on this After Visit Summary.  MyChart is used to connect with patients for Virtual Visits (Telemedicine).  Patients are able to view lab/test results, encounter notes, upcoming appointments, etc.  Non-urgent messages can be sent to your provider as well.   To learn more about what you can do with MyChart, go to [email or web address].    Your next appointment:   1 month(s)  The format for your next appointment:   In Person  Provider:   Thomasene Ripple, DO   Other Instructions NA

## 2020-03-19 ENCOUNTER — Ambulatory Visit (INDEPENDENT_AMBULATORY_CARE_PROVIDER_SITE_OTHER): Payer: Medicaid Other | Admitting: Cardiology

## 2020-03-19 ENCOUNTER — Other Ambulatory Visit: Payer: Self-pay

## 2020-03-19 ENCOUNTER — Encounter: Payer: Self-pay | Admitting: Cardiology

## 2020-03-19 VITALS — BP 118/68 | HR 75 | Ht 63.0 in | Wt 139.0 lb

## 2020-03-19 DIAGNOSIS — I4729 Other ventricular tachycardia: Secondary | ICD-10-CM

## 2020-03-19 DIAGNOSIS — I455 Other specified heart block: Secondary | ICD-10-CM

## 2020-03-19 DIAGNOSIS — I471 Supraventricular tachycardia, unspecified: Secondary | ICD-10-CM

## 2020-03-19 DIAGNOSIS — R0989 Other specified symptoms and signs involving the circulatory and respiratory systems: Secondary | ICD-10-CM | POA: Diagnosis not present

## 2020-03-19 DIAGNOSIS — I10 Essential (primary) hypertension: Secondary | ICD-10-CM | POA: Diagnosis not present

## 2020-03-19 DIAGNOSIS — I493 Ventricular premature depolarization: Secondary | ICD-10-CM | POA: Diagnosis not present

## 2020-03-19 DIAGNOSIS — I472 Ventricular tachycardia: Secondary | ICD-10-CM

## 2020-03-19 DIAGNOSIS — I491 Atrial premature depolarization: Secondary | ICD-10-CM

## 2020-03-19 HISTORY — DX: Other ventricular tachycardia: I47.29

## 2020-03-19 HISTORY — DX: Supraventricular tachycardia: I47.1

## 2020-03-19 HISTORY — DX: Ventricular tachycardia: I47.2

## 2020-03-19 HISTORY — DX: Other specified heart block: I45.5

## 2020-03-19 HISTORY — DX: Atrial premature depolarization: I49.1

## 2020-03-19 HISTORY — DX: Supraventricular tachycardia, unspecified: I47.10

## 2020-03-19 NOTE — Patient Instructions (Signed)
Medication Instructions:  Your physician recommends that you continue on your current medications as directed. Please refer to the Current Medication list given to you today.  *If you need a refill on your cardiac medications before your next appointment, please call your pharmacy*   Lab Work: None If you have labs (blood work) drawn today and your tests are completely normal, you will receive your results only by: Marland Kitchen MyChart Message (if you have MyChart) OR . A paper copy in the mail If you have any lab test that is abnormal or we need to change your treatment, we will call you to review the results.   Testing/Procedures: None   Follow-Up: At [facility], you and your health needs are our priority.  As part of our continuing mission to provide you with exceptional heart care, we have created designated Provider Care Teams.  These Care Teams include your primary Cardiologist (physician) and Advanced Practice Providers (APPs -  Physician Assistants and Nurse Practitioners) who all work together to provide you with the care you need, when you need it.  We recommend signing up for the patient portal called "MyChart".  Sign up information is provided on this After Visit Summary.  MyChart is used to connect with patients for Virtual Visits (Telemedicine).  Patients are able to view lab/test results, encounter notes, upcoming appointments, etc.  Non-urgent messages can be sent to your provider as well.   To learn more about what you can do with MyChart, go to [email or web address].    Your next appointment:   3 month(s)  The format for your next appointment:   In Person  Provider:   Thomasene Ripple, DO   Other Instructions We have put in a referral for you to see our cardiac electrophysiology doctor. You can schedule that up-front with [place] today.

## 2020-03-19 NOTE — Progress Notes (Signed)
Cardiology Office Note:    Date:  03/19/2020   ID:  Taylor Paul, DOB September 06, 1956, MRN [id number]  PCP:  Kendra Opitz, NP  Cardiologist:  Thomasene Ripple, DO  Electrophysiologist:  None   Referring MD: Kendra Opitz, NP   " I am having a lot of fluttering"  History of Present Illness:    Taylor Paul is a 64 y.o. female with a hx of heart failure with depressed ejection fraction, hypertension, hyperlipidemia, smoking, frequent symptomatic premature atrial complexes, nonsustained ventricular tachycardia presents today for follow-up visit.  I saw the patient on February 18, 2020 at that we discussed her stress test results showed no evidence of ischemia.  She still was experiencing chest pain which had resolved with nitroglycerin.  During the visit we were still waiting for her monitor results.  In the interim her monitor results was reviewed by me which showed evidence of NSVT, paroxysmal SVT likely atrial tachycardia -there is also a 3 second sinus pause.   Today she tells me that she is still experiencing intermittent chest pain usually with palpitations.  She offers no other complaints at this time no shortness of breath.  Past Medical History:  Diagnosis Date  . Acute gastric ulcer with perforation (HCC) 06/05/2019  . Congestive heart disease (HCC)   . COPD (chronic obstructive pulmonary disease) (HCC)   . Encounter for screening colonoscopy 12/25/2019  . Essential hypertension 01/18/2020  . Hypertension   . Mixed hyperlipidemia 01/18/2020  . Other chest pain 01/18/2020  . Postoperative examination 06/05/2019  . Preoperative clearance 01/18/2020  . PVC (premature ventricular contraction) 01/18/2020  . Smoker 01/18/2020    Past Surgical History:  Procedure Laterality Date  . APPENDECTOMY    . STOMACH SURGERY     Perforated gastric ulcer    Current Medications: Current Meds  Medication Sig  . atorvastatin (LIPITOR) 20 MG tablet 20 mg daily.  . carvedilol (COREG) 6.25 MG  tablet Take 1 tablet (6.25 mg total) by mouth 2 (two) times daily.  . furosemide (LASIX) 20 MG tablet Take 20 mg by mouth daily.  Marland Kitchen lisinopril (ZESTRIL) 20 MG tablet Take 20 mg by mouth daily.  . nitroGLYCERIN (NITROSTAT) 0.4 MG SL tablet Place 1 tablet (0.4 mg total) under the tongue every 5 (five) minutes as needed for chest pain.     Allergies:   Amoxicillin   Social History   Socioeconomic History  . Marital status: Unknown    Spouse name: Not on file  . Number of children: Not on file  . Years of education: Not on file  . Highest education level: Not on file  Occupational History  . Not on file  Tobacco Use  . Smoking status: Current Every Day Smoker  . Smokeless tobacco: Never Used  Substance and Sexual Activity  . Alcohol use: Never  . Drug use: Never  . Sexual activity: Not on file  Other Topics Concern  . Not on file  Social History Narrative  . Not on file   Social Determinants of Health   Financial Resource Strain:   . Difficulty of Paying Living Expenses:   Food Insecurity:   . Worried About Programme researcher, broadcasting/film/video in the Last Year:   . Barista in the Last Year:   Transportation Needs:   . Freight forwarder (Medical):   Marland Kitchen Lack of Transportation (Non-Medical):   Physical Activity:   . Days of Exercise per Week:   . Minutes of  Exercise per Session:   Stress:   . Feeling of Stress :   Social Connections:   . Frequency of Communication with Friends and Family:   . Frequency of Social Gatherings with Friends and Family:   . Attends Religious Services:   . Active Member of Clubs or Organizations:   . Attends Banker Meetings:   Marland Kitchen Marital Status:      Family History: The patient's family history includes Congestive Heart Failure in her father; Diabetes in her brother, father, and mother; Heart disease in her brother; Hypertension in her brother, father, and mother; Stroke in her brother.  ROS:   Review of Systems  Constitution:  Negative for decreased appetite, fever and weight gain.  HENT: Negative for congestion, ear discharge, hoarse voice and sore throat.   Eyes: Negative for discharge, redness, vision loss in right eye and visual halos.  Cardiovascular: Reports chest pain and palpitations.  Negative dyspnea on exertion, leg swelling, orthopnea and palpitations.  Respiratory: Negative for cough, hemoptysis, shortness of breath and snoring.   Endocrine: Negative for heat intolerance and polyphagia.  Hematologic/Lymphatic: Negative for bleeding problem. Does not bruise/bleed easily.  Skin: Negative for flushing, nail changes, rash and suspicious lesions.  Musculoskeletal: Negative for arthritis, joint pain, muscle cramps, myalgias, neck pain and stiffness.  Gastrointestinal: Negative for abdominal pain, bowel incontinence, diarrhea and excessive appetite.  Genitourinary: Negative for decreased libido, genital sores and incomplete emptying.  Neurological: Negative for brief paralysis, focal weakness, headaches and loss of balance.  Psychiatric/Behavioral: Negative for altered mental status, depression and suicidal ideas.  Allergic/Immunologic: Negative for HIV exposure and persistent infections.    EKGs/Labs/Other Studies Reviewed:    The following studies were reviewed today:   EKG: None  Zio monitor  The patient wore the monitor for 13 days 8 hours starting Jan 18, 2020. Indication: PVC The minimum heart rate was 48 bpm, maximum heart rate was 184  bpm, and average heart rate was 73  bpm. Predominant underlying rhythm was Sinus Rhythm. First Degree AV Block was present. Bundle Branch Block/IVCD was present.   1 run of Ventricular Tachycardia occurred lasting 6 beats with a maximum rate of 184 bpm (average 170 bpm).   23 Supraventricular Tachycardia runs occurred, the run with the fastest interval lasting 16 beats with a maximum heart rate of 136 bpm (average 119 bpm); the run with the fastest interval was  also the longest.   1 Pause occurred lasting 3.1 seconds (20 bpm). (5:30 am on 01/19/20).  Premature atrial complexes were frequent (8.0%, [id number]).  Premature Ventricular complexes were rare (<1.0%, 20).  No atrial fibrillation present. 5 patient triggered events and 5 diary events, all associated with premature atrial complexes.  Conclusion: This study is remarkable for the following:                             1. Sinus pause lasting 3.1 seconds (20 bpm) (5:30 am on 01/19/20).                             2. Nonsustained Ventricular tachycardia (1 run with 6 beats).                             3. Paroxysmal supraventricular tachycardia,  4. Frequent Premature atrial complexes (8.0%, 112496).   Recent Labs: 01/18/2020: BUN 28; Creatinine, Ser 1.48; Magnesium 2.3; Potassium 4.0; Sodium 145  Recent Lipid Panel No results found for: CHOL, TRIG, HDL, CHOLHDL, VLDL, LDLCALC, LDLDIRECT  Physical Exam:    VS:  BP 118/68   Pulse 75   Ht 5\' 3"  (1.6 m)   Wt 139 lb (63 kg)   SpO2 97%   BMI 24.62 kg/m     Wt Readings from Last 3 Encounters:  03/19/20 139 lb (63 kg)  02/18/20 137 lb (62.1 kg)  01/29/20 136 lb (61.7 kg)     GEN: Well nourished, well developed in no acute distress HEENT: Normal NECK: No JVD; No carotid bruits LYMPHATICS: No lymphadenopathy CARDIAC: S1S2 noted,RRR, no murmurs, rubs, gallops RESPIRATORY:  Clear to auscultation without rales, wheezing or rhonchi  ABDOMEN: Soft, non-tender, non-distended, +bowel sounds, no guarding. EXTREMITIES: No edema, No cyanosis, no clubbing MUSCULOSKELETAL:  No deformity  SKIN: Warm and dry NEUROLOGIC:  Alert and oriented x 3, non-focal PSYCHIATRIC:  Normal affect, good insight  ASSESSMENT:    1. PAC (premature atrial contraction)   2. PVC (premature ventricular contraction)   3. Depressed left ventricular ejection fraction   4. Essential hypertension   5. NSVT (nonsustained ventricular  tachycardia) (HCC)   6. Paroxysmal SVT (supraventricular tachycardia) (HCC)   7. Sinus pause    PLAN:     She still is experiencing similar palpitations, I started patient on beta-blocker however on her monitor she did show evidence of sinus pauses which I suspect in the setting of beta-blocker as well as possible sleep apnea.  She with her frequent PACs/NSVT she might benefit from antiarrhythmics for now I will refer her to EP for their insight as well.  She notes that the symptoms are not detrimental and not limiting therefore for now I am going to hold off on increasing her beta-blocker until she sees EP as she may need to be on antiarrhythmics.  She is pending sleep study.  Depressed ejection fraction-continue patient on her beta-blocker as well as ACE inhibitor.  She still is having intermittent chest pain-since her last visit she has not needed any nitroglycerin we will continue to monitor.  We may need to consider other ischemic evaluation using likely coronary CTA.  He is going to monitor her symptoms she also was advised if this chest pain worsened to go to the nearest emergency department.  The patient is in agreement with the above plan. The patient left the office in stable condition.  The patient will follow up in 3 months or sooner if needed.  Medication Adjustments/Labs and Tests Ordered: Current medicines are reviewed at length with the patient today.  Concerns regarding medicines are outlined above.  Orders Placed This Encounter  Procedures  . Ambulatory referral to Cardiac Electrophysiology   No orders of the defined types were placed in this encounter.   Patient Instructions  Medication Instructions:  Your physician recommends that you continue on your current medications as directed. Please refer to the Current Medication list given to you today.  *If you need a refill on your cardiac medications before your next appointment, please call your pharmacy*   Lab  Work: None If you have labs (blood work) drawn today and your tests are completely normal, you will receive your results only by: Marland Kitchen MyChart Message (if you have MyChart) OR . A paper copy in the mail If you have any lab test that is abnormal or  we need to change your treatment, we will call you to review the results.   Testing/Procedures: None   Follow-Up: At [facility], you and your health needs are our priority.  As part of our continuing mission to provide you with exceptional heart care, we have created designated Provider Care Teams.  These Care Teams include your primary Cardiologist (physician) and Advanced Practice Providers (APPs -  Physician Assistants and Nurse Practitioners) who all work together to provide you with the care you need, when you need it.  We recommend signing up for the patient portal called "MyChart".  Sign up information is provided on this After Visit Summary.  MyChart is used to connect with patients for Virtual Visits (Telemedicine).  Patients are able to view lab/test results, encounter notes, upcoming appointments, etc.  Non-urgent messages can be sent to your provider as well.   To learn more about what you can do with MyChart, go to [email or web address].    Your next appointment:   3 month(s)  The format for your next appointment:   In Person  Provider:   Thomasene Ripple, DO   Other Instructions We have put in a referral for you to see our cardiac electrophysiology doctor. You can schedule that up-front with [place] today.      Adopting a Healthy Lifestyle.  Know what a healthy weight is for you (roughly BMI <25) and aim to maintain this   Aim for 7+ servings of fruits and vegetables daily   65-80+ fluid ounces of water or unsweet tea for healthy kidneys   Limit to max 1 drink of alcohol per day; avoid smoking/tobacco   Limit animal fats in diet for cholesterol and heart health - choose grass fed whenever available   Avoid highly  processed foods, and foods high in saturated/trans fats   Aim for low stress - take time to unwind and care for your mental health   Aim for 150 min of moderate intensity exercise weekly for heart health, and weights twice weekly for bone health   Aim for 7-9 hours of sleep daily   When it comes to diets, agreement about the perfect plan isnt easy to find, even among the experts. Experts at the [facility] [employer] developed an idea known as the Healthy Eating Plate. Just imagine a plate divided into logical, healthy portions.   The emphasis is on diet quality:   Load up on vegetables and fruits - one-half of your plate: Aim for color and variety, and remember that potatoes dont count.   Go for whole grains - one-quarter of your plate: Whole wheat, barley, wheat berries, quinoa, oats, brown rice, and foods made with them. If you want pasta, go with whole wheat pasta.   Protein power - one-quarter of your plate: Fish, chicken, beans, and nuts are all healthy, versatile protein sources. Limit red meat.   The diet, however, does go beyond the plate, offering a few other suggestions.   Use healthy plant oils, such as olive, canola, soy, corn, sunflower and peanut. Check the labels, and avoid partially hydrogenated oil, which have unhealthy trans fats.   If youre thirsty, drink water. Coffee and tea are good in moderation, but skip sugary drinks and limit milk and dairy products to one or two daily servings.   The type of carbohydrate in the diet is more important than the amount. Some sources of carbohydrates, such as vegetables, fruits, whole grains, and beans-are healthier than others.   Finally, stay  active  Signed, Thomasene Ripple, DO  03/19/2020 1:43 PM    [facility]

## 2020-05-26 ENCOUNTER — Ambulatory Visit (INDEPENDENT_AMBULATORY_CARE_PROVIDER_SITE_OTHER): Payer: Medicaid Other | Admitting: Cardiology

## 2020-05-26 ENCOUNTER — Encounter: Payer: Self-pay | Admitting: Cardiology

## 2020-05-26 ENCOUNTER — Other Ambulatory Visit: Payer: Self-pay

## 2020-05-26 VITALS — BP 110/62 | HR 74 | Ht 63.0 in | Wt 136.0 lb

## 2020-05-26 DIAGNOSIS — I471 Supraventricular tachycardia: Secondary | ICD-10-CM | POA: Diagnosis not present

## 2020-05-26 NOTE — Progress Notes (Signed)
Electrophysiology Office Note   Date:  05/26/2020   ID:  Taylor Paul, DOB 11-14-1955, MRN [id number]  PCP:  Taylor Opitz, NP  Cardiologist:  Taylor Paul Primary Electrophysiologist:  Taylor Paul Taylor Loa, MD    Chief Complaint: palpitations   History of Present Illness: Taylor Paul is a 64 y.o. female who is being seen today for the evaluation of PAC at the request of Taylor Opitz, NP. Presenting today for electrophysiology evaluation.  She has a history significant for heart failure with reduced ejection fraction, hypertension, hyperlipidemia, tobacco abuse, PACs, and nonsustained VT.  She had a recent Myoview that showed no evidence of ischemia.  She wore a cardiac monitor which showed short runs of SVT.  Today, she denies symptoms of palpitations, chest pain, shortness of breath, orthopnea, PND, lower extremity edema, claudication, dizziness, presyncope, syncope, bleeding, or neurologic sequela. The patient is tolerating medications without difficulties.  In speaking with the patient, she is in her normal state of health.  She has shortness of breath, but this is chronic which she feels is due to her COPD and chronic systolic heart failure.  She does state that she continues to smoke.  She is unaware of palpitations.  She does not feel fluttering in her chest.   Past Medical History:  Diagnosis Date   Acute gastric ulcer with perforation (HCC) 06/05/2019   Congestive heart disease (HCC)    COPD (chronic obstructive pulmonary disease) (HCC)    Encounter for screening colonoscopy 12/25/2019   Essential hypertension 01/18/2020   Hypertension    Mixed hyperlipidemia 01/18/2020   Other chest pain 01/18/2020   Postoperative examination 06/05/2019   Preoperative clearance 01/18/2020   PVC (premature ventricular contraction) 01/18/2020   Smoker 01/18/2020   Past Surgical History:  Procedure Laterality Date   APPENDECTOMY     STOMACH SURGERY     Perforated gastric ulcer       Current Outpatient Medications  Medication Sig Dispense Refill   atorvastatin (LIPITOR) 20 MG tablet 20 mg daily.     carvedilol (COREG) 6.25 MG tablet Take 1 tablet (6.25 mg total) by mouth 2 (two) times daily. 180 tablet 3   furosemide (LASIX) 20 MG tablet Take 20 mg by mouth daily.     lisinopril (ZESTRIL) 20 MG tablet Take 20 mg by mouth daily.     nitroGLYCERIN (NITROSTAT) 0.4 MG SL tablet Place 1 tablet (0.4 mg total) under the tongue every 5 (five) minutes as needed for chest pain. 25 tablet 3   No current facility-administered medications for this visit.    Allergies:   Amoxicillin   Social History:  The patient  reports that she has been smoking. She has never used smokeless tobacco. She reports that she does not drink alcohol and does not use drugs.   Family History:  The patient's family history includes Congestive Heart Failure in her father; Diabetes in her brother, father, and mother; Heart disease in her brother; Hypertension in her brother, father, and mother; Stroke in her brother.    ROS:  Please see the history of present illness.   Otherwise, review of systems is positive for none.   All other systems are reviewed and negative.    PHYSICAL EXAM: VS:  There were no vitals taken for this visit. , BMI There is no height or weight on file to calculate BMI. GEN: Well nourished, well developed, in no acute distress  HEENT: normal  Neck: no JVD, carotid bruits, or masses Cardiac:  RRR; no murmurs, rubs, or gallops,no edema  Respiratory:  clear to auscultation bilaterally, normal work of breathing GI: soft, nontender, nondistended, + BS MS: no deformity or atrophy  Skin: warm and dry Neuro:  Strength and sensation are intact Psych: euthymic mood, full affect  EKG:  EKG is ordered today. Personal review of the ekg ordered shows sinus rhythm, rate 74, QRS 130 ms with IVCD    Recent Labs: 01/18/2020: BUN 28; Creatinine, Ser 1.48; Magnesium 2.3; Potassium  4.0; Sodium 145    Lipid Panel  No results found for: CHOL, TRIG, HDL, CHOLHDL, VLDL, LDLCALC, LDLDIRECT   Wt Readings from Last 3 Encounters:  03/19/20 139 lb (63 kg)  02/18/20 137 lb (62.1 kg)  01/29/20 136 lb (61.7 kg)      Other studies Reviewed: Additional studies/ records that were reviewed today include: Myoview 01/29/20  Review of the above records today demonstrates:   The left ventricular ejection fraction is mildly decreased (45-54%).  Nuclear stress EF: 45%.  There was no ST segment deviation noted during stress.  No evidence of ischemia. Soft tissue attenuation in the inferior segment of the LV.  Monitor 02/21/20 1. Sinus pause lasting 3.1 seconds (20 bpm) (5:30 am on 01/19/20). 2. Nonsustained Ventricular tachycardia (1 run with 6 beats). 3. Paroxysmal supraventricular tachycardia 4. Frequent Premature atrial complexes (8.0%, [id number]).    ASSESSMENT AND PLAN:  1.  SVT: Potentially due to PACs, though this is difficult to determine.  In speaking with the patient, she has no symptoms, not aware of palpitations.  Her main complaint is breath.  Would hold off on further therapy.  If she does develop symptoms, would evaluate her at that time for antiarrhythmics.  2.  Hypertension: Currently well controlled  3.  Chronic systolic heart failure with mild reduced ejection fraction: No obvious volume overload.  Continue Coreg and lisinopril per primary cardiology.  Case discussed with primary cardiology  Current medicines are reviewed at length with the patient today.   The patient does not have concerns regarding her medicines.  The following changes were made today:  none  Labs/ tests ordered today include:  No orders of the defined types were placed in this encounter.    Disposition:   FU with Taylor Paul as needed  Signed, Taylor Paul Taylor Loa, MD  05/26/2020 10:48 AM     [facility] [address] [place]  [address] [phone] (office) [phone] (fax)

## 2020-06-25 DIAGNOSIS — I509 Heart failure, unspecified: Secondary | ICD-10-CM | POA: Insufficient documentation

## 2020-06-25 DIAGNOSIS — J449 Chronic obstructive pulmonary disease, unspecified: Secondary | ICD-10-CM | POA: Insufficient documentation

## 2020-06-26 ENCOUNTER — Other Ambulatory Visit: Payer: Self-pay

## 2020-06-26 ENCOUNTER — Ambulatory Visit (INDEPENDENT_AMBULATORY_CARE_PROVIDER_SITE_OTHER): Payer: Medicaid Other | Admitting: Cardiology

## 2020-06-26 ENCOUNTER — Encounter: Payer: Self-pay | Admitting: Cardiology

## 2020-06-26 VITALS — BP 120/60 | HR 68 | Ht 63.0 in | Wt 139.0 lb

## 2020-06-26 DIAGNOSIS — I493 Ventricular premature depolarization: Secondary | ICD-10-CM

## 2020-06-26 DIAGNOSIS — R0989 Other specified symptoms and signs involving the circulatory and respiratory systems: Secondary | ICD-10-CM

## 2020-06-26 DIAGNOSIS — I471 Supraventricular tachycardia: Secondary | ICD-10-CM

## 2020-06-26 DIAGNOSIS — I10 Essential (primary) hypertension: Secondary | ICD-10-CM

## 2020-06-26 DIAGNOSIS — I491 Atrial premature depolarization: Secondary | ICD-10-CM | POA: Diagnosis not present

## 2020-06-26 DIAGNOSIS — I4729 Other ventricular tachycardia: Secondary | ICD-10-CM

## 2020-06-26 DIAGNOSIS — I472 Ventricular tachycardia: Secondary | ICD-10-CM | POA: Diagnosis not present

## 2020-06-26 DIAGNOSIS — E782 Mixed hyperlipidemia: Secondary | ICD-10-CM

## 2020-06-26 DIAGNOSIS — F17200 Nicotine dependence, unspecified, uncomplicated: Secondary | ICD-10-CM

## 2020-06-26 NOTE — Patient Instructions (Signed)

## 2020-06-26 NOTE — Progress Notes (Signed)
Cardiology Office Note:    Date:  06/26/2020   ID:  Taylor Paul, DOB 1956/06/06, MRN [id number]  PCP:  Kendra Opitz, NP  Cardiologist:  Thomasene Ripple, DO  Electrophysiologist:  None   Referring MD: Kendra Opitz, NP   " I am doing fine"  History of Present Illness:    Taylor Paul is a 64 y.o. female with a hx of heart failure with depressed ejection fraction, hypertension, hyperlipidemia, smoking, frequent symptomatic premature atrial complexes, nonsustained ventricular tachycardia presents today for follow-up visit.  I saw the patient on February 18, 2020 at that we discussed her stress test results showed no evidence of ischemia.  She still was experiencing chest pain which had resolved with nitroglycerin.      I saw the patient March 19, 2020 we discussed her results from her Zio monitor which showedshowed evidence of NSVT, paroxysmal SVT likely atrial tachycardia -there is also a 3 second sinus pause and frequent PACs.Marland Kitchen   during that visit I recommended the patient to see EP as well as we kept her beta-blocker dose the same.  She was able to see EP in the interim and at this time no intervention is planned watchful waiting.  She is here today for follow-up visit.  She denies any chest pain, shortness of breath.  She is frustrated with her son but has been working on this.   Past Medical History:  Diagnosis Date  . Acute gastric ulcer with perforation (HCC) 06/05/2019  . Congestive heart disease (HCC)   . COPD (chronic obstructive pulmonary disease) (HCC)   . Depressed left ventricular ejection fraction 02/18/2020  . Encounter for screening colonoscopy 12/25/2019  . Essential hypertension 01/18/2020  . Hypertension   . Mixed hyperlipidemia 01/18/2020  . NSVT (nonsustained ventricular tachycardia) (HCC) 03/19/2020  . Other chest pain 01/18/2020  . PAC (premature atrial contraction) 03/19/2020  . Paroxysmal SVT (supraventricular tachycardia) (HCC) 03/19/2020  . Postoperative  examination 06/05/2019  . Preoperative clearance 01/18/2020  . PVC (premature ventricular contraction) 01/18/2020  . Sinus pause 03/19/2020  . Smoker 01/18/2020    Past Surgical History:  Procedure Laterality Date  . APPENDECTOMY    . STOMACH SURGERY     Perforated gastric ulcer    Current Medications: Current Meds  Medication Sig  . atorvastatin (LIPITOR) 20 MG tablet 20 mg daily.  . carvedilol (COREG) 6.25 MG tablet Take 1 tablet (6.25 mg total) by mouth 2 (two) times daily.  . furosemide (LASIX) 20 MG tablet Take 10 mg by mouth daily.   Marland Kitchen lisinopril (ZESTRIL) 20 MG tablet Take 20 mg by mouth daily.  . nitroGLYCERIN (NITROSTAT) 0.4 MG SL tablet Place 1 tablet (0.4 mg total) under the tongue every 5 (five) minutes as needed for chest pain.     Allergies:   Amoxicillin   Social History   Socioeconomic History  . Marital status: Unknown    Spouse name: Not on file  . Number of children: Not on file  . Years of education: Not on file  . Highest education level: Not on file  Occupational History  . Not on file  Tobacco Use  . Smoking status: Current Every Day Smoker  . Smokeless tobacco: Never Used  Substance and Sexual Activity  . Alcohol use: Never  . Drug use: Never  . Sexual activity: Not on file  Other Topics Concern  . Not on file  Social History Narrative  . Not on file   Social Determinants of  Health   Financial Resource Strain:   . Difficulty of Paying Living Expenses: Not on file  Food Insecurity:   . Worried About Programme researcher, broadcasting/film/video in the Last Year: Not on file  . Ran Out of Food in the Last Year: Not on file  Transportation Needs:   . Lack of Transportation (Medical): Not on file  . Lack of Transportation (Non-Medical): Not on file  Physical Activity:   . Days of Exercise per Week: Not on file  . Minutes of Exercise per Session: Not on file  Stress:   . Feeling of Stress : Not on file  Social Connections:   . Frequency of Communication with Friends  and Family: Not on file  . Frequency of Social Gatherings with Friends and Family: Not on file  . Attends Religious Services: Not on file  . Active Member of Clubs or Organizations: Not on file  . Attends Banker Meetings: Not on file  . Marital Status: Not on file     Family History: The patient's family history includes Congestive Heart Failure in her father; Diabetes in her brother, father, and mother; Heart disease in her brother; Hypertension in her brother, father, and mother; Stroke in her brother.  ROS:   Review of Systems  Constitution: Negative for decreased appetite, fever and weight gain.  HENT: Negative for congestion, ear discharge, hoarse voice and sore throat.   Eyes: Negative for discharge, redness, vision loss in right eye and visual halos.  Cardiovascular: Negative for chest pain, dyspnea on exertion, leg swelling, orthopnea and palpitations.  Respiratory: Negative for cough, hemoptysis, shortness of breath and snoring.   Endocrine: Negative for heat intolerance and polyphagia.  Hematologic/Lymphatic: Negative for bleeding problem. Does not bruise/bleed easily.  Skin: Negative for flushing, nail changes, rash and suspicious lesions.  Musculoskeletal: Negative for arthritis, joint pain, muscle cramps, myalgias, neck pain and stiffness.  Gastrointestinal: Negative for abdominal pain, bowel incontinence, diarrhea and excessive appetite.  Genitourinary: Negative for decreased libido, genital sores and incomplete emptying.  Neurological: Negative for brief paralysis, focal weakness, headaches and loss of balance.  Psychiatric/Behavioral: Negative for altered mental status, depression and suicidal ideas.  Allergic/Immunologic: Negative for HIV exposure and persistent infections.    EKGs/Labs/Other Studies Reviewed:    The following studies were reviewed today:   EKG: None today  Zio monitor  The patient wore the monitor for 13 days 8 hours starting Jan 18, 2020. Indication: PVC The minimum heart rate was 48 bpm, maximum heart rate was 184 bpm, and average heart rate was 73 bpm. Predominant underlying rhythm was Sinus Rhythm. First Degree AV Block was present. Bundle Branch Block/IVCD was present.   1 run of Ventricular Tachycardia occurred lasting 6 beats with a maximum rate of 184 bpm (average 170 bpm).   23 Supraventricular Tachycardia runs occurred, the run with the fastest interval lasting 16 beats with a maximum heart rate of 136 bpm (average 119 bpm); the run with the fastest interval was also the longest.   1 Pause occurred lasting 3.1 seconds (20 bpm). (5:30 am on 01/19/20).  Premature atrial complexes were frequent (8.0%, [id number]). Premature Ventricular complexes were rare (<1.0%, 20).  No atrial fibrillation present. 5 patient triggered events and 5 diary events, all associated with premature atrial complexes.  Conclusion: This study is remarkable for the following: 1. Sinus pause lasting 3.1 seconds (20 bpm) (5:30 am on 01/19/20). 2. Nonsustained Ventricular tachycardia (1 run with 6 beats). 3. Paroxysmal supraventricular tachycardia,  4. Frequent Premature atrial complexes (8.0%, [id number]).   Recent Labs: 01/18/2020: BUN 28; Creatinine, Ser 1.48; Magnesium 2.3; Potassium 4.0; Sodium 145  Recent Lipid Panel No results found for: CHOL, TRIG, HDL, CHOLHDL, VLDL, LDLCALC, LDLDIRECT  Physical Exam:    VS:  BP 120/60 (BP Location: Left Arm, Patient Position: Sitting, Cuff Size: Normal)   Pulse 68   Ht 5\' 3"  (1.6 m)   Wt 139 lb (63 kg)   SpO2 96%   BMI 24.62 kg/m     Wt Readings from Last 3 Encounters:  06/26/20 139 lb (63 kg)  05/26/20 136 lb (61.7 kg)  03/19/20 139 lb (63 kg)     GEN: Well nourished, well developed in no acute distress HEENT: Normal NECK: No JVD; No carotid  bruits LYMPHATICS: No lymphadenopathy CARDIAC: S1S2 noted,RRR, no murmurs, rubs, gallops RESPIRATORY:  Clear to auscultation without rales, wheezing or rhonchi  ABDOMEN: Soft, non-tender, non-distended, +bowel sounds, no guarding. EXTREMITIES: No edema, No cyanosis, no clubbing MUSCULOSKELETAL:  No deformity  SKIN: Warm and dry NEUROLOGIC:  Alert and oriented x 3, non-focal PSYCHIATRIC:  Normal affect, good insight  ASSESSMENT:    1. PVC (premature ventricular contraction)   2. Essential hypertension   3. PAC (premature atrial contraction)   4. NSVT (nonsustained ventricular tachycardia) (HCC)   5. Paroxysmal SVT (supraventricular tachycardia) (HCC)   6. Smoker   7. Depressed left ventricular ejection fraction    PLAN:      No changes in medication today.  We will continue patient on current medication regimen.  She has had no evidence of syncope as well.  Her blood pressure deceptively in the office today no changes will be made to antihypertensive medication regimen.  Smoking cessation advised.  Continue beta-blocker, she has not no symptoms from her PVCs no palpitations at all.  Continue Lipitor 20 mg daily.    The patient is in agreement with the above plan. The patient left the office in stable condition.  The patient will follow up in   Medication Adjustments/Labs and Tests Ordered: Current medicines are reviewed at length with the patient today.  Concerns regarding medicines are outlined above.  No orders of the defined types were placed in this encounter.  No orders of the defined types were placed in this encounter.   Patient Instructions  Medication Instructions:  Your physician recommends that you continue on your current medications as directed. Please refer to the Current Medication list given to you today.  *If you need a refill on your cardiac medications before your next appointment, please call your pharmacy*   Lab Work: None. If you have labs  (blood work) drawn today and your tests are completely normal, you will receive your results only by: Marland Kitchen MyChart Message (if you have MyChart) OR . A paper copy in the mail If you have any lab test that is abnormal or we need to change your treatment, we will call you to review the results.   Testing/Procedures: None   Follow-Up: At [facility], you and your health needs are our priority.  As part of our continuing mission to provide you with exceptional heart care, we have created designated Provider Care Teams.  These Care Teams include your primary Cardiologist (physician) and Advanced Practice Providers (APPs -  Physician Assistants and Nurse Practitioners) who all work together to provide you with the care you need, when you need it.  We recommend signing up for the patient portal called "MyChart".  Sign up  information is provided on this After Visit Summary.  MyChart is used to connect with patients for Virtual Visits (Telemedicine).  Patients are able to view lab/test results, encounter notes, upcoming appointments, etc.  Non-urgent messages can be sent to your provider as well.   To learn more about what you can do with MyChart, go to [email or web address].    Your next appointment:   6 month(s)  The format for your next appointment:   In Person  Provider:   Thomasene Ripple, DO   Other Instructions      Adopting a Healthy Lifestyle.  Know what a healthy weight is for you (roughly BMI <25) and aim to maintain this   Aim for 7+ servings of fruits and vegetables daily   65-80+ fluid ounces of water or unsweet tea for healthy kidneys   Limit to max 1 drink of alcohol per day; avoid smoking/tobacco   Limit animal fats in diet for cholesterol and heart health - choose grass fed whenever available   Avoid highly processed foods, and foods high in saturated/trans fats   Aim for low stress - take time to unwind and care for your mental health   Aim for 150 min of  moderate intensity exercise weekly for heart health, and weights twice weekly for bone health   Aim for 7-9 hours of sleep daily   When it comes to diets, agreement about the perfect plan isnt easy to find, even among the experts. Experts at the [facility] of [employer] developed an idea known as the Healthy Eating Plate. Just imagine a plate divided into logical, healthy portions.   The emphasis is on diet quality:   Load up on vegetables and fruits - one-half of your plate: Aim for color and variety, and remember that potatoes dont count.   Go for whole grains - one-quarter of your plate: Whole wheat, barley, wheat berries, quinoa, oats, brown rice, and foods made with them. If you want pasta, go with whole wheat pasta.   Protein power - one-quarter of your plate: Fish, chicken, beans, and nuts are all healthy, versatile protein sources. Limit red meat.   The diet, however, does go beyond the plate, offering a few other suggestions.   Use healthy plant oils, such as olive, canola, soy, corn, sunflower and peanut. Check the labels, and avoid partially hydrogenated oil, which have unhealthy trans fats.   If youre thirsty, drink water. Coffee and tea are good in moderation, but skip sugary drinks and limit milk and dairy products to one or two daily servings.   The type of carbohydrate in the diet is more important than the amount. Some sources of carbohydrates, such as vegetables, fruits, whole grains, and beans-are healthier than others.   Finally, stay active  Signed, Thomasene Ripple, DO  06/26/2020 3:10 PM    [facility]

## 2020-12-23 ENCOUNTER — Encounter: Payer: Self-pay | Admitting: Cardiology

## 2020-12-23 ENCOUNTER — Ambulatory Visit (INDEPENDENT_AMBULATORY_CARE_PROVIDER_SITE_OTHER): Payer: Medicaid Other | Admitting: Cardiology

## 2020-12-23 ENCOUNTER — Other Ambulatory Visit: Payer: Self-pay

## 2020-12-23 VITALS — BP 110/68 | HR 94 | Ht 63.0 in | Wt 129.4 lb

## 2020-12-23 DIAGNOSIS — R0789 Other chest pain: Secondary | ICD-10-CM

## 2020-12-23 DIAGNOSIS — R0602 Shortness of breath: Secondary | ICD-10-CM

## 2020-12-23 DIAGNOSIS — I10 Essential (primary) hypertension: Secondary | ICD-10-CM | POA: Diagnosis not present

## 2020-12-23 DIAGNOSIS — I491 Atrial premature depolarization: Secondary | ICD-10-CM

## 2020-12-23 HISTORY — DX: Other chest pain: R07.89

## 2020-12-23 HISTORY — DX: Shortness of breath: R06.02

## 2020-12-23 NOTE — Progress Notes (Signed)
Cardiology Office Note:    Date:  12/23/2020   ID:  Taylor DireDonna L Willden, DOB 04/02/1956, MRN [id number]  PCP:  Kendra Opitzole, Dawn Watkins, NP  Cardiologist:  Thomasene RippleKardie Jamarien Rodkey, DO  Electrophysiologist:  None   Referring MD: Kendra Opitzole, Dawn Watkins, NP   -Having worsening chest pain  History of Present Illness:    Taylor Paul is a 65 y.o. female with a hx of heart failure with depressed ejection fraction, hypertension, hyperlipidemia, smoking, frequent symptomatic premature atrial complexes, nonsustained ventricular tachycardia presents today for follow-up visit.  I saw the patient on February 18, 2020 at thatwe discussed her stress test results showed no evidence of ischemia. She still was experiencing chest pain which had resolved with nitroglycerin.     I saw the patient March 19, 2020 we discussed her results from her Zio monitor which showedshowed evidence of NSVT, paroxysmal SVT likely atrial tachycardia -there is alsoa 3 second sinus pause and frequent PACs.Marland Kitchen. during that visit I recommended the patient to see EP as well as we kept her beta-blocker dose the same.  She was able to see EP in the interim and at this time no intervention is planned watchful waiting.  I saw the patient on June 26, 2020 at that time she was not experiencing any chest pain.  She tells me that she was doing well from a cardiovascular standpoint.  Since her last visit she has had a hospitalization at [facility] where she had endoscopy showing gastritis.  She has follow-up planned with GI.  She tells me that her chest pain is now getting worse she has intermittent left-sided chest pain.  The patient described her left-sided chest pain as a squeezing sensation.  Is intermittent.  It last for seconds and then resolved.  Nothing makes this better or worse.  She has associated shortness of breath.  Past Medical History:  Diagnosis Date  . Acute gastric ulcer with perforation (HCC) 06/05/2019  . Congestive heart disease (HCC)    . COPD (chronic obstructive pulmonary disease) (HCC)   . Depressed left ventricular ejection fraction 02/18/2020  . Encounter for screening colonoscopy 12/25/2019  . Essential hypertension 01/18/2020  . Hypertension   . Mixed hyperlipidemia 01/18/2020  . NSVT (nonsustained ventricular tachycardia) (HCC) 03/19/2020  . Other chest pain 01/18/2020  . PAC (premature atrial contraction) 03/19/2020  . Paroxysmal SVT (supraventricular tachycardia) (HCC) 03/19/2020  . Postoperative examination 06/05/2019  . Preoperative clearance 01/18/2020  . PVC (premature ventricular contraction) 01/18/2020  . Sinus pause 03/19/2020  . Smoker 01/18/2020    Past Surgical History:  Procedure Laterality Date  . APPENDECTOMY    . STOMACH SURGERY     Perforated gastric ulcer    Current Medications: Current Meds  Medication Sig  . atorvastatin (LIPITOR) 20 MG tablet Take 20 mg by mouth daily.  . carvedilol (COREG) 6.25 MG tablet Take 1 tablet (6.25 mg total) by mouth 2 (two) times daily.  . cetirizine (ZYRTEC) 10 MG tablet Take 10 mg by mouth daily.  . furosemide (LASIX) 20 MG tablet Take 10 mg by mouth daily.   Marland Kitchen. lisinopril (ZESTRIL) 20 MG tablet Take 20 mg by mouth daily.  . montelukast (SINGULAIR) 10 MG tablet Take 1 tablet by mouth daily.  . pantoprazole (PROTONIX) 40 MG tablet Take 1 tablet by mouth 2 (two) times daily.  Marland Kitchen. PROAIR HFA 108 (90 Base) MCG/ACT inhaler Inhale 2 puffs into the lungs every 6 (six) hours as needed.  . SYMBICORT 160-4.5 MCG/ACT inhaler Inhale 2  puffs into the lungs in the morning and at bedtime.     Allergies:   Amoxicillin   Social History   Socioeconomic History  . Marital status: Unknown    Spouse name: Not on file  . Number of children: Not on file  . Years of education: Not on file  . Highest education level: Not on file  Occupational History  . Not on file  Tobacco Use  . Smoking status: Current Every Day Smoker  . Smokeless tobacco: Never Used  Substance and Sexual Activity  .  Alcohol use: Never  . Drug use: Never  . Sexual activity: Not on file  Other Topics Concern  . Not on file  Social History Narrative  . Not on file   Social Determinants of Health   Financial Resource Strain: Not on file  Food Insecurity: Not on file  Transportation Needs: Not on file  Physical Activity: Not on file  Stress: Not on file  Social Connections: Not on file     Family History: The patient's family history includes Congestive Heart Failure in her father; Diabetes in her brother, father, and mother; Heart disease in her brother; Hypertension in her brother, father, and mother; Stroke in her brother.  ROS:   Review of Systems  Constitution: Negative for decreased appetite, fever and weight gain.  HENT: Negative for congestion, ear discharge, hoarse voice and sore throat.   Eyes: Negative for discharge, redness, vision loss in right eye and visual halos.  Cardiovascular: Reports chest pain, dyspnea on exertion.  Negative for leg swelling, orthopnea and palpitations.  Respiratory: Negative for cough, hemoptysis, shortness of breath and snoring.   Endocrine: Negative for heat intolerance and polyphagia.  Hematologic/Lymphatic: Negative for bleeding problem. Does not bruise/bleed easily.  Skin: Negative for flushing, nail changes, rash and suspicious lesions.  Musculoskeletal: Negative for arthritis, joint pain, muscle cramps, myalgias, neck pain and stiffness.  Gastrointestinal: Negative for abdominal pain, bowel incontinence, diarrhea and excessive appetite.  Genitourinary: Negative for decreased libido, genital sores and incomplete emptying.  Neurological: Negative for brief paralysis, focal weakness, headaches and loss of balance.  Psychiatric/Behavioral: Negative for altered mental status, depression and suicidal ideas.  Allergic/Immunologic: Negative for HIV exposure and persistent infections.    EKGs/Labs/Other Studies Reviewed:    The following studies were  reviewed today:   EKG:  None today  Zio monitor The patient wore the monitor for 13 days 8 hours starting Jan 18, 2020. Indication: PVC The minimum heart rate was 48 bpm, maximum heart rate was 184 bpm, and average heart rate was 73 bpm. Predominant underlying rhythm was Sinus Rhythm. First Degree AV Block was present. Bundle Branch Block/IVCD was present.   1 run of Ventricular Tachycardia occurred lasting 6 beats with a maximum rate of 184 bpm (average 170 bpm).   23 Supraventricular Tachycardia runs occurred, the run with the fastest interval lasting 16 beats with a maximum heart rate of 136 bpm (average 119 bpm); the run with the fastest interval was also the longest.   1 Pause occurred lasting 3.1 seconds (20 bpm). (5:30 am on 01/19/20).  Premature atrial complexes were frequent (8.0%, [id number]). Premature Ventricular complexes were rare (<1.0%, 20).  No atrial fibrillation present. 5 patient triggered events and 5 diary events, all associated with premature atrial complexes.  Conclusion: This study is remarkable for the following: 1. Sinus pause lasting 3.1 seconds (20 bpm) (5:30 am on 01/19/20). 2. Nonsustained Ventricular tachycardia (1 run with 6 beats). 3. Paroxysmal supraventricular  tachycardia,  4. Frequent Premature atrial complexes (8.0%, [id number]).   Pharmacologic nuclear stress test in May 2021  The left ventricular ejection fraction is mildly decreased (45-54%).  Nuclear stress EF: 45%.  There was no ST segment deviation noted during stress.  No evidence of ischemia. Soft tissue attenuation in the inferior segment of the LV.   Recent Labs: 01/18/2020: BUN 28; Creatinine, Ser 1.48; Magnesium 2.3; Potassium 4.0; Sodium 145  Recent Lipid Panel No results found for: CHOL, TRIG, HDL, CHOLHDL, VLDL, LDLCALC, LDLDIRECT  Physical Exam:     VS:  BP 110/68   Pulse 94   Ht 5\' 3"  (1.6 m)   Wt 129 lb 6.4 oz (58.7 kg)   SpO2 96%   BMI 22.92 kg/m     Wt Readings from Last 3 Encounters:  12/23/20 129 lb 6.4 oz (58.7 kg)  06/26/20 139 lb (63 kg)  05/26/20 136 lb (61.7 kg)     GEN: Well nourished, well developed in no acute distress HEENT: Normal NECK: No JVD; No carotid bruits LYMPHATICS: No lymphadenopathy CARDIAC: S1S2 noted,RRR, no murmurs, rubs, gallops RESPIRATORY:  Clear to auscultation without rales, wheezing or rhonchi  ABDOMEN: Soft, non-tender, non-distended, +bowel sounds, no guarding. EXTREMITIES: No edema, No cyanosis, no clubbing MUSCULOSKELETAL:  No deformity  SKIN: Warm and dry NEUROLOGIC:  Alert and oriented x 3, non-focal PSYCHIATRIC:  Normal affect, good insight  ASSESSMENT:    1. Chest discomfort   2. Other chest pain   3. Hypertension, unspecified type   4. Shortness of breath   5. PAC (premature atrial contraction)    PLAN:     She had a stress test in May 2021 which was with no evidence of ischemia however EF was 45%.  He still is experiencing intermittent chest pain.  We will like to do is proceed with a coronary CTA in this patient to assess for any evidence of coronary artery disease.  Blood pressure is acceptable in the office today.  We will continue her beta-blocker in terms of her PACs.  Smoking cessation advised.  The patient is in agreement with the above plan. The patient left the office in stable condition.  The patient will follow up in 3 months or sooner if needed.   Medication Adjustments/Labs and Tests Ordered: Current medicines are reviewed at length with the patient today.  Concerns regarding medicines are outlined above.  Orders Placed This Encounter  Procedures  . CT CORONARY MORPH W/CTA COR W/SCORE W/CA W/CM &/OR WO/CM  . CT CORONARY FRACTIONAL FLOW RESERVE DATA PREP  . CT CORONARY FRACTIONAL FLOW RESERVE FLUID ANALYSIS  . Basic metabolic panel  . Magnesium   . CBC with Differential/Platelet   No orders of the defined types were placed in this encounter.   Patient Instructions  Medication Instructions:  Your physician recommends that you continue on your current medications as directed. Please refer to the Current Medication list given to you today.  *If you need a refill on your cardiac medications before your next appointment, please call your pharmacy*   Lab Work: Your physician recommends that you return for lab work: TODAY: BMET, Mag, CBC If you have labs (blood work) drawn today and your tests are completely normal, you will receive your results only by: June 2021 MyChart Message (if you have MyChart) OR . A paper copy in the mail If you have any lab test that is abnormal or we need to change your treatment, we will call you to review the results.  Testing/Procedures: Your cardiac CT will be scheduled at:  [facility] [address] [place], [place] [address] [phone]  If scheduled at [facility], please arrive at the [facility] main entrance (entrance A) of [facility] 30 minutes prior to test start time. Proceed to the [facility] Radiology Department (first floor) to check-in and test prep.   Please follow these instructions carefully (unless otherwise directed):   On the Night Before the Test: . Be sure to Drink plenty of water. . Do not consume any caffeinated/decaffeinated beverages or chocolate 12 hours prior to your test. . Do not take any antihistamines 12 hours prior to your test.  On the Day of the Test: . Drink plenty of water until 1 hour prior to the test. . Do not eat any food 4 hours prior to the test. . You may take your regular medications prior to the test.  . Take carvedilol (Coreg) two hours prior to test. . HOLD Furosemide/Hydrochlorothiazide morning of the test. . FEMALES- please wear underwire-free bra if available       After the Test: . Drink plenty of  water. . After receiving IV contrast, you may experience a mild flushed feeling. This is normal. . On occasion, you may experience a mild rash up to 24 hours after the test. This is not dangerous. If this occurs, you can take Benadryl 25 mg and increase your fluid intake. . If you experience trouble breathing, this can be serious. If it is severe call 911 IMMEDIATELY. If it is mild, please call our office. . If you take any of these medications: Glipizide/Metformin, Avandament, Glucavance, please do not take 48 hours after completing test unless otherwise instructed.   Once we have confirmed authorization from your insurance company, we will call you to set up a date and time for your test. Based on how quickly your insurance processes prior authorizations requests, please allow up to 4 weeks to be contacted for scheduling your Cardiac CT appointment. Be advised that routine Cardiac CT appointments could be scheduled as many as 8 weeks after your provider has ordered it.  For non-scheduling related questions, please contact the cardiac imaging nurse navigator should you have any questions/concerns: Rockwell Alexandria, Cardiac Imaging Nurse Navigator Larey Brick, Cardiac Imaging Nurse Navigator [facility] Direct Office Dial: [phone]   For scheduling needs, including cancellations and rescheduling, please call [place], [phone].     Follow-Up: At [facility], you and your health needs are our priority.  As part of our continuing mission to provide you with exceptional heart care, we have created designated Provider Care Teams.  These Care Teams include your primary Cardiologist (physician) and Advanced Practice Providers (APPs -  Physician Assistants and Nurse Practitioners) who all work together to provide you with the care you need, when you need it.  We recommend signing up for the patient portal called "MyChart".  Sign up information is provided on this  After Visit Summary.  MyChart is used to connect with patients for Virtual Visits (Telemedicine).  Patients are able to view lab/test results, encounter notes, upcoming appointments, etc.  Non-urgent messages can be sent to your provider as well.   To learn more about what you can do with MyChart, go to [email or web address].    Your next appointment:   3 month(s)  The format for your next appointment:   In Person  Provider:   Thomasene Ripple, DO   Other Instructions  Adopting a Healthy Lifestyle.  Know what a healthy weight is for you (roughly BMI <25) and aim to maintain this   Aim for 7+ servings of fruits and vegetables daily   65-80+ fluid ounces of water or unsweet tea for healthy kidneys   Limit to max 1 drink of alcohol per day; avoid smoking/tobacco   Limit animal fats in diet for cholesterol and heart health - choose grass fed whenever available   Avoid highly processed foods, and foods high in saturated/trans fats   Aim for low stress - take time to unwind and care for your mental health   Aim for 150 min of moderate intensity exercise weekly for heart health, and weights twice weekly for bone health   Aim for 7-9 hours of sleep daily   When it comes to diets, agreement about the perfect plan isnt easy to find, even among the experts. Experts at the [facility] [employer] developed an idea known as the Healthy Eating Plate. Just imagine a plate divided into logical, healthy portions.   The emphasis is on diet quality:   Load up on vegetables and fruits - one-half of your plate: Aim for color and variety, and remember that potatoes dont count.   Go for whole grains - one-quarter of your plate: Whole wheat, barley, wheat berries, quinoa, oats, brown rice, and foods made with them. If you want pasta, go with whole wheat pasta.   Protein power - one-quarter of your plate: Fish, chicken, beans, and nuts are all healthy, versatile protein sources.  Limit red meat.   The diet, however, does go beyond the plate, offering a few other suggestions.   Use healthy plant oils, such as olive, canola, soy, corn, sunflower and peanut. Check the labels, and avoid partially hydrogenated oil, which have unhealthy trans fats.   If youre thirsty, drink water. Coffee and tea are good in moderation, but skip sugary drinks and limit milk and dairy products to one or two daily servings.   The type of carbohydrate in the diet is more important than the amount. Some sources of carbohydrates, such as vegetables, fruits, whole grains, and beans-are healthier than others.   Finally, stay active  Signed, Thomasene Ripple, DO  12/23/2020 1:46 PM    [facility]

## 2020-12-23 NOTE — Patient Instructions (Addendum)
Medication Instructions:  Your physician recommends that you continue on your current medications as directed. Please refer to the Current Medication list given to you today.  *If you need a refill on your cardiac medications before your next appointment, please call your pharmacy*   Lab Work: Your physician recommends that you return for lab work: TODAY: BMET, Mag, CBC If you have labs (blood work) drawn today and your tests are completely normal, you will receive your results only by: Marland Kitchen MyChart Message (if you have MyChart) OR . A paper copy in the mail If you have any lab test that is abnormal or we need to change your treatment, we will call you to review the results.   Testing/Procedures: Your cardiac CT will be scheduled at:  [facility] [address] [place], [place] [address] [phone]  If scheduled at [facility], please arrive at the [facility] main entrance (entrance A) of [facility] 30 minutes prior to test start time. Proceed to the [facility] Radiology Department (first floor) to check-in and test prep.   Please follow these instructions carefully (unless otherwise directed):   On the Night Before the Test: . Be sure to Drink plenty of water. . Do not consume any caffeinated/decaffeinated beverages or chocolate 12 hours prior to your test. . Do not take any antihistamines 12 hours prior to your test.  On the Day of the Test: . Drink plenty of water until 1 hour prior to the test. . Do not eat any food 4 hours prior to the test. . You may take your regular medications prior to the test.  . Take carvedilol (Coreg) two hours prior to test. . HOLD Furosemide/Hydrochlorothiazide morning of the test. . FEMALES- please wear underwire-free bra if available       After the Test: . Drink plenty of water. . After receiving IV contrast, you may experience a mild flushed feeling. This is normal. . On occasion, you may experience a  mild rash up to 24 hours after the test. This is not dangerous. If this occurs, you can take Benadryl 25 mg and increase your fluid intake. . If you experience trouble breathing, this can be serious. If it is severe call 911 IMMEDIATELY. If it is mild, please call our office. . If you take any of these medications: Glipizide/Metformin, Avandament, Glucavance, please do not take 48 hours after completing test unless otherwise instructed.   Once we have confirmed authorization from your insurance company, we will call you to set up a date and time for your test. Based on how quickly your insurance processes prior authorizations requests, please allow up to 4 weeks to be contacted for scheduling your Cardiac CT appointment. Be advised that routine Cardiac CT appointments could be scheduled as many as 8 weeks after your provider has ordered it.  For non-scheduling related questions, please contact the cardiac imaging nurse navigator should you have any questions/concerns: Rockwell Alexandria, Cardiac Imaging Nurse Navigator Larey Brick, Cardiac Imaging Nurse Navigator [facility] Direct Office Dial: [phone]   For scheduling needs, including cancellations and rescheduling, please call [place], [phone].     Follow-Up: At [facility], you and your health needs are our priority.  As part of our continuing mission to provide you with exceptional heart care, we have created designated Provider Care Teams.  These Care Teams include your primary Cardiologist (physician) and Advanced Practice Providers (APPs -  Physician Assistants and Nurse Practitioners) who  all work together to provide you with the care you need, when you need it.  We recommend signing up for the patient portal called "MyChart".  Sign up information is provided on this After Visit Summary.  MyChart is used to connect with patients for Virtual Visits (Telemedicine).  Patients are able to view lab/test  results, encounter notes, upcoming appointments, etc.  Non-urgent messages can be sent to your provider as well.   To learn more about what you can do with MyChart, go to [email or web address].    Your next appointment:   3 month(s)  The format for your next appointment:   In Person  Provider:   Thomasene Ripple, DO   Other Instructions

## 2020-12-24 LAB — CBC WITH DIFFERENTIAL/PLATELET
Basophils Absolute: 0.1 10*3/uL (ref 0.0–0.2)
Basos: 1 %
EOS (ABSOLUTE): 0.3 10*3/uL (ref 0.0–0.4)
Eos: 3 %
Hematocrit: 40.6 % (ref 34.0–46.6)
Hemoglobin: 13 g/dL (ref 11.1–15.9)
Immature Grans (Abs): 0 10*3/uL (ref 0.0–0.1)
Immature Granulocytes: 0 %
Lymphocytes Absolute: 3 10*3/uL (ref 0.7–3.1)
Lymphs: 34 %
MCH: 29.1 pg (ref 26.6–33.0)
MCHC: 32 g/dL (ref 31.5–35.7)
MCV: 91 fL (ref 79–97)
Monocytes Absolute: 0.4 10*3/uL (ref 0.1–0.9)
Monocytes: 4 %
Neutrophils Absolute: 5 10*3/uL (ref 1.4–7.0)
Neutrophils: 58 %
Platelets: 263 10*3/uL (ref 150–450)
RBC: 4.46 x10E6/uL (ref 3.77–5.28)
RDW: 17.8 % — ABNORMAL HIGH (ref 11.7–15.4)
WBC: 8.7 10*3/uL (ref 3.4–10.8)

## 2020-12-24 LAB — BASIC METABOLIC PANEL
BUN/Creatinine Ratio: 20 (ref 12–28)
BUN: 31 mg/dL — ABNORMAL HIGH (ref 8–27)
CO2: 20 mmol/L (ref 20–29)
Calcium: 8.7 mg/dL (ref 8.7–10.3)
Chloride: 106 mmol/L (ref 96–106)
Creatinine, Ser: 1.55 mg/dL — ABNORMAL HIGH (ref 0.57–1.00)
Glucose: 107 mg/dL — ABNORMAL HIGH (ref 65–99)
Potassium: 4.8 mmol/L (ref 3.5–5.2)
Sodium: 144 mmol/L (ref 134–144)
eGFR: 38 mL/min/{1.73_m2} — ABNORMAL LOW (ref >59)

## 2020-12-24 LAB — MAGNESIUM: Magnesium: 2 mg/dL (ref 1.6–2.3)

## 2021-01-02 ENCOUNTER — Other Ambulatory Visit: Payer: Self-pay | Admitting: Cardiology

## 2021-01-06 ENCOUNTER — Telehealth (HOSPITAL_COMMUNITY): Payer: Self-pay | Admitting: *Deleted

## 2021-01-06 ENCOUNTER — Other Ambulatory Visit (HOSPITAL_COMMUNITY): Payer: Self-pay | Admitting: Emergency Medicine

## 2021-01-06 ENCOUNTER — Telehealth (HOSPITAL_COMMUNITY): Payer: Self-pay | Admitting: Emergency Medicine

## 2021-01-06 DIAGNOSIS — R0789 Other chest pain: Secondary | ICD-10-CM

## 2021-01-06 DIAGNOSIS — I472 Ventricular tachycardia: Secondary | ICD-10-CM

## 2021-01-06 DIAGNOSIS — I4729 Other ventricular tachycardia: Secondary | ICD-10-CM

## 2021-01-06 MED ORDER — IVABRADINE HCL 5 MG PO TABS: ORAL_TABLET | ORAL | 0 refills | Status: DC

## 2021-01-06 MED ORDER — IVABRADINE HCL 5 MG PO TABS
10.0000 mg | ORAL_TABLET | Freq: Once | ORAL | 0 refills | Status: AC
Start: 2021-01-06 — End: 2021-01-06

## 2021-01-06 NOTE — Telephone Encounter (Signed)
Pt returning call regarding upcoming cardiac imaging study; pt verbalizes understanding of appt date/time, parking situation and where to check in, pre-test NPO status and medications ordered, and verified current allergies; name and call back number provided for further questions should they arise  Larey Brick RN Navigator Cardiac Imaging [facility] [phone] office [phone] cell  Pt states that her pharmacy was unable to provide the 10mg  Corlanor needed for the test.  Therefore a sample of medication will be made available to patient by 11am on Thursday April 28 for pt.  Pt aware to check into to Admitting for her Infusion Clinic appointment prior to cardiac CT scan.   Pharmacist from [facility] office made aware of need for sample.

## 2021-01-06 NOTE — Telephone Encounter (Signed)
Corlanor 10mg  samples logged and placed up front

## 2021-01-06 NOTE — Telephone Encounter (Signed)
Attempted to call patient regarding upcoming cardiac CT appointment. °Left message on voicemail with name and callback number °Maekayla Giorgio RN Navigator Cardiac Imaging °[facility] °[phone] Office °[phone] Cell ° °

## 2021-01-08 ENCOUNTER — Other Ambulatory Visit: Payer: Self-pay

## 2021-01-08 ENCOUNTER — Ambulatory Visit (HOSPITAL_COMMUNITY)
Admission: RE | Admit: 2021-01-08 | Discharge: 2021-01-08 | Disposition: A | Discharge status: Home / Self Care | Payer: Medicaid Other | Source: Ambulatory Visit | Attending: Cardiology | Admitting: Cardiology

## 2021-01-08 DIAGNOSIS — I2510 Atherosclerotic heart disease of native coronary artery without angina pectoris: Secondary | ICD-10-CM

## 2021-01-08 DIAGNOSIS — R0789 Other chest pain: Secondary | ICD-10-CM | POA: Insufficient documentation

## 2021-01-08 LAB — BASIC METABOLIC PANEL
Anion gap: 8 (ref 5–15)
BUN: 32 mg/dL — ABNORMAL HIGH (ref 8–23)
CO2: 24 mmol/L (ref 22–32)
Calcium: 9.3 mg/dL (ref 8.9–10.3)
Chloride: 109 mmol/L (ref 98–111)
Creatinine, Ser: 1.6 mg/dL — ABNORMAL HIGH (ref 0.44–1.00)
GFR, Estimated: 36 mL/min — ABNORMAL LOW (ref >60)
Glucose, Bld: 95 mg/dL (ref 70–99)
Potassium: 4.4 mmol/L (ref 3.5–5.1)
Sodium: 141 mmol/L (ref 135–145)

## 2021-01-08 IMAGING — CT CT HEART MORP W/ CTA COR W/ SCORE W/ CA W/CM &/OR W/O CM
4 of 7 series · 8 of 20 positions shown, 9 images · non-contrast
Comparison: None.
COMPARISON: None.

Addendum:
EXAM:
OVER-READ INTERPRETATION  CT CHEST

The following report is an over-read performed by radiologist Dr.
KPACHIN [REDACTED] on [DATE]. This
over-read does not include interpretation of cardiac or coronary
anatomy or pathology. The coronary calcium score/coronary CTA
interpretation by the cardiologist is attached.
CLINICAL DATA: This is a 64 year old female with chest pain.
Cardiac/Coronary  CTA
TECHNIQUE: The patient was scanned on a [employer] Force scanner.

[Series 6: best diast 70 % · axial · 0.39mm/px · z∈[-107,-70]mm · 2 of 278 slices shown]
[im 93/278  vessel]
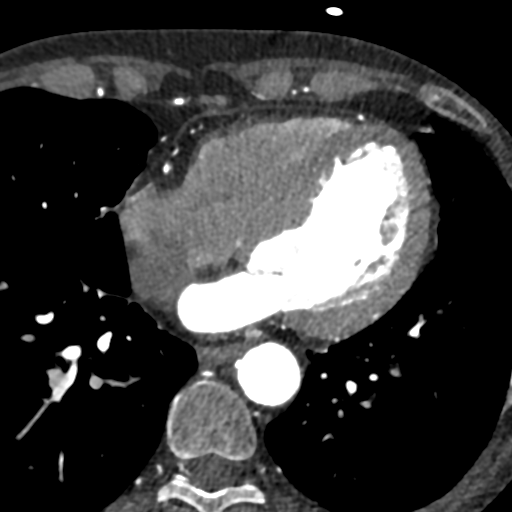
[im 185/278  vessel]
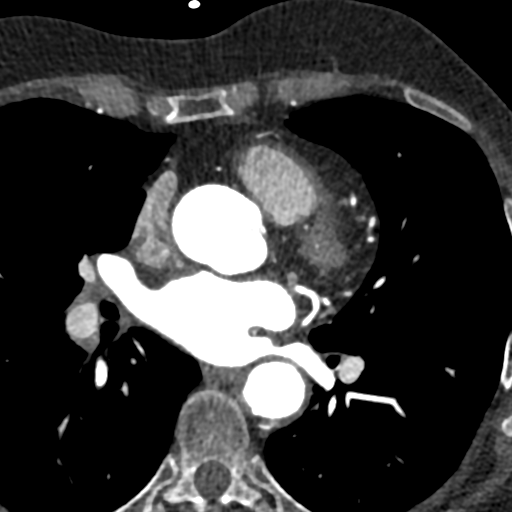

[Series 7: best syst · axial · 0.39mm/px · z∈[-107,-70]mm · 2 of 278 slices shown, 3 images]
[im 93/278  vessel]
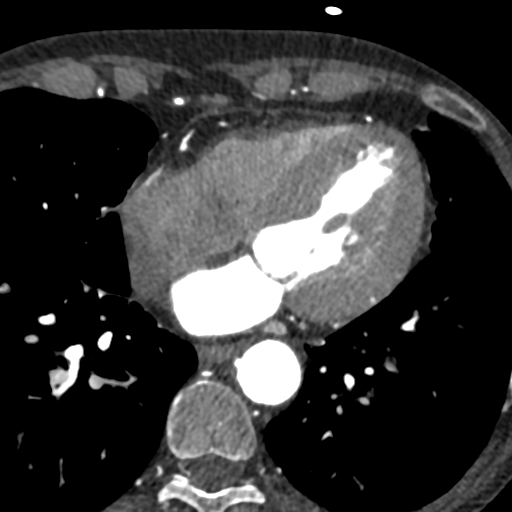
[im 93/278  lung]
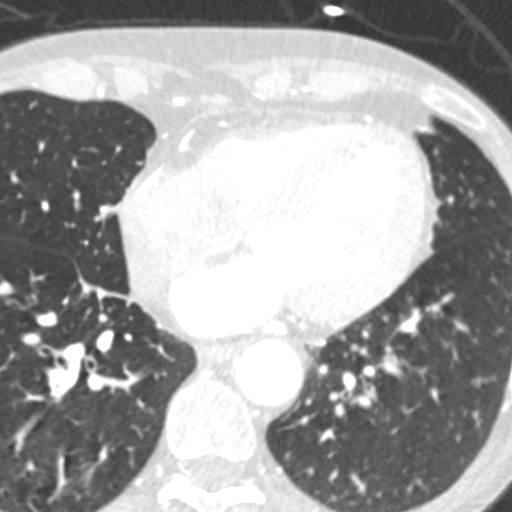
[im 185/278  vessel]
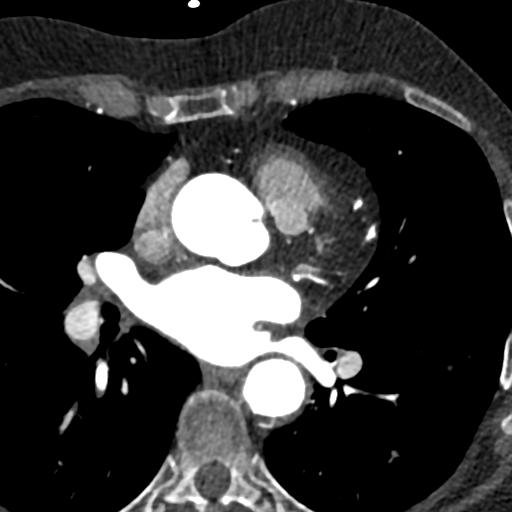

[Series 9: ts diast sharp 70 % · axial · 0.39mm/px · z∈[-107,-70]mm · 2 of 278 slices shown]
[im 93/278  lung]
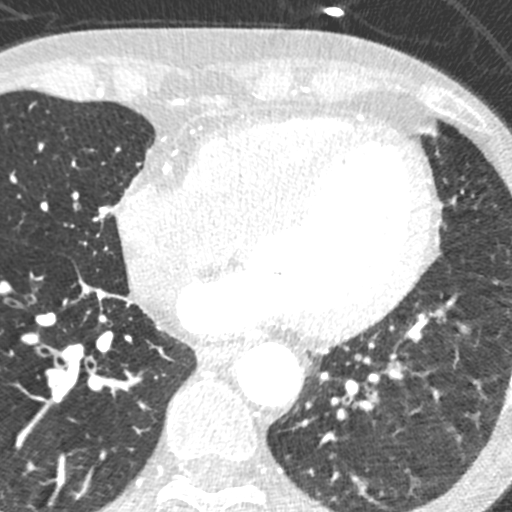
[im 185/278  lung]
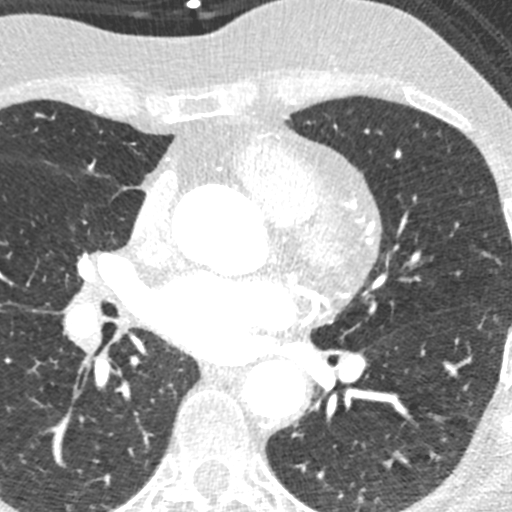

[Series 10: ts syst sharp · axial · 0.39mm/px · z∈[-107,-70]mm · 2 of 278 slices shown]
[im 93/278  lung]
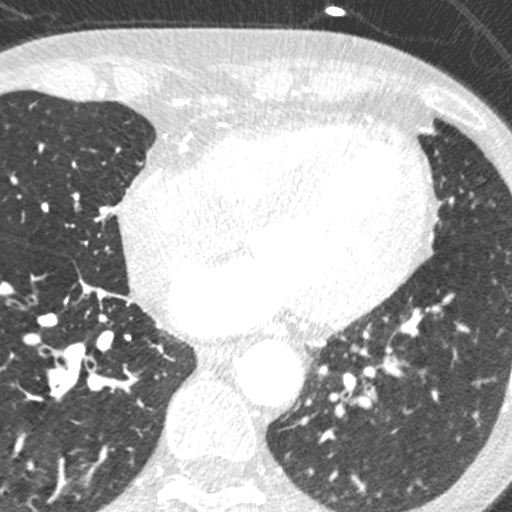
[im 185/278  lung]
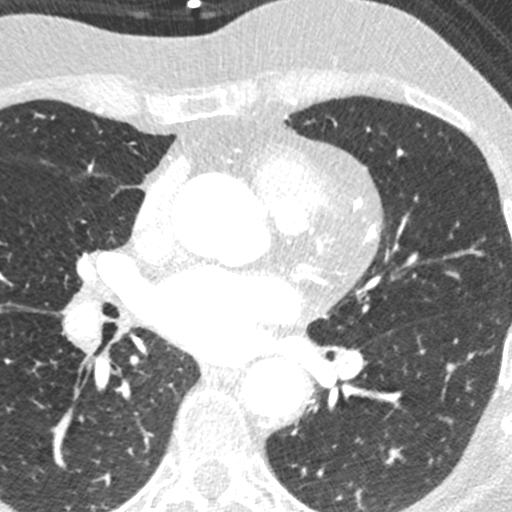

[8 of 20 positions shown; findings below may reference images not displayed]

FINDINGS: Aortic atherosclerosis. Within the visualized portions of the thorax
there are no suspicious appearing pulmonary nodules or masses, there
is no acute consolidative airspace disease, no pleural effusions, no
pneumothorax and no lymphadenopathy. Visualized portions of the
upper abdomen are unremarkable. There are no aggressive appearing
lytic or blastic lesions noted in the visualized portions of the
skeleton.
IMPRESSION: 1.  Aortic Atherosclerosis ([EY]-[EY]).
FINDINGS: A 100 kV prospective scan was triggered in the descending thoracic
aorta at 111 HU's. Axial non-contrast 3 mm slices were carried out
through the heart. The data set was analyzed on a dedicated work
station and scored using the Agatson method. Gantry rotation speed
was 250 msecs and collimation was .6 mm. No beta blockade and 0.8 mg
of sl NTG was given. The 3D data set was reconstructed in 5%
intervals of the 67-82 % of the R-R cycle. Diastolic phases were
analyzed on a dedicated work station using MPR, MIP and VRT modes.
The patient received 80 cc of contrast.

Aorta:  Normal size.  No calcifications.  No dissection.

Aortic Valve:  Trileaflet.  No calcifications.

Coronary Arteries:  Normal coronary origin.  Right dominance.

RCA is a large dominant artery that gives rise to PDA and PLA. There
is minimal (<24%) soft plaque in the proximal RCA. The mid RCA with
minimal calcified plaque. The distal RCA with no plaques.

Left main is a large artery that gives rise to LAD and LCX arteries.
The distal Left main with mild (25-49%) calcified plaque.

LAD is a large vessel. The ostial LAD with mild calcified plaque.
The mid LAD with moderate (50-69%) calcified plaque. The distal LAD
with no plaque. D1 is a medium sized vessel with no plaques.

LCX is a non-dominant artery that gives rise to 3 small OM branches.
There ostial LCX with mild calcified plaque. OM1 with proximal mild
mixed plaque. OM2 and OM3 with no plaques.

Other findings:

Normal pulmonary vein drainage into the left atrium.

Normal left atrial appendage without a thrombus.

Normal size of the pulmonary artery.
IMPRESSION: 1. Coronary calcium score of 218. This was 93 percentile for age and
sex matched control.

2. Normal coronary origin with right dominance.

3 Moderate CAD. CAD-RADS 3. Consider symptom-guided anti-ischemic
pharmacotherapy as well as risk factor modification per guideline
directed care. Additional analysis with CT FFR will be submitted.

*** End of Addendum ***
EXAM:
OVER-READ INTERPRETATION  CT CHEST

The following report is an over-read performed by radiologist Dr.
KPACHIN [REDACTED] on [DATE]. This
over-read does not include interpretation of cardiac or coronary
anatomy or pathology. The coronary calcium score/coronary CTA
interpretation by the cardiologist is attached.
FINDINGS: Aortic atherosclerosis. Within the visualized portions of the thorax
there are no suspicious appearing pulmonary nodules or masses, there
is no acute consolidative airspace disease, no pleural effusions, no
pneumothorax and no lymphadenopathy. Visualized portions of the
upper abdomen are unremarkable. There are no aggressive appearing
lytic or blastic lesions noted in the visualized portions of the
skeleton.
IMPRESSION: 1.  Aortic Atherosclerosis ([EY]-[EY]).

## 2021-01-08 MED ORDER — SODIUM CHLORIDE 0.9 % WEIGHT BASED INFUSION: 1.0000 mL/kg/h | INTRAVENOUS | Status: DC

## 2021-01-08 MED ORDER — NITROGLYCERIN 0.4 MG SL SUBL
0.8000 mg | SUBLINGUAL_TABLET | Freq: Once | SUBLINGUAL | Status: AC
  Administered 2021-01-08: 0.8 mg via SUBLINGUAL

## 2021-01-08 MED ORDER — IOHEXOL 350 MG/ML SOLN
80.0000 mL | Freq: Once | INTRAVENOUS | Status: AC | PRN
  Administered 2021-01-08: 80 mL via INTRAVENOUS

## 2021-01-08 MED ORDER — NITROGLYCERIN 0.4 MG SL SUBL
SUBLINGUAL_TABLET | SUBLINGUAL | Status: AC
  Filled 2021-01-08: qty 2

## 2021-01-08 MED ORDER — NITROGLYCERIN 0.4 MG SL SUBL
SUBLINGUAL_TABLET | SUBLINGUAL | Status: AC
  Filled 2021-01-08: qty 1

## 2021-01-08 MED ORDER — SODIUM CHLORIDE 0.9 % WEIGHT BASED INFUSION
3.0000 mL/kg/h | INTRAVENOUS | Status: DC
  Administered 2021-01-08: 3 mL/kg/h via INTRAVENOUS

## 2021-01-08 NOTE — Progress Notes (Signed)
Spoke with [place] in CT.  Let her know the patient would be ready for CT at 1315 and she was in room 11 in the infusion clinic

## 2021-01-09 ENCOUNTER — Telehealth: Payer: Self-pay | Admitting: Cardiology

## 2021-01-09 ENCOUNTER — Ambulatory Visit (HOSPITAL_COMMUNITY)
Admission: RE | Admit: 2021-01-09 | Discharge: 2021-01-09 | Disposition: A | Discharge status: Home / Self Care | Payer: Medicaid Other | Source: Ambulatory Visit | Attending: Cardiology | Admitting: Cardiology

## 2021-01-09 DIAGNOSIS — R0789 Other chest pain: Secondary | ICD-10-CM | POA: Insufficient documentation

## 2021-01-09 NOTE — Telephone Encounter (Signed)
Patient returning call for lab results. 

## 2021-01-09 NOTE — Telephone Encounter (Signed)
PT os returning a phone call about lab results.Please advise

## 2021-01-09 NOTE — Telephone Encounter (Signed)
Results reviewed with pt as per Dr. Tobb's note.  Pt verbalized understanding and had no additional questions. Routed to PCP 

## 2021-01-13 DIAGNOSIS — I2510 Atherosclerotic heart disease of native coronary artery without angina pectoris: Secondary | ICD-10-CM | POA: Diagnosis not present

## 2021-01-13 DIAGNOSIS — R931 Abnormal findings on diagnostic imaging of heart and coronary circulation: Secondary | ICD-10-CM | POA: Diagnosis not present

## 2021-01-22 DIAGNOSIS — I447 Left bundle-branch block, unspecified: Secondary | ICD-10-CM | POA: Diagnosis not present

## 2021-04-21 ENCOUNTER — Ambulatory Visit: Payer: Medicaid Other | Admitting: Cardiology

## 2021-04-21 NOTE — Progress Notes (Deleted)
Cardiology Office Note:    Date:  04/21/2021   ID:  Taylor Paul, DOB Aug 12, 1956, MRN [id number]  PCP:  Kendra Opitz, NP  Cardiologist:  Thomasene Ripple, DO  Electrophysiologist:  None   Referring MD: Kendra Opitz, NP   No chief complaint on file.   History of Present Illness:    Taylor Paul is a 65 y.o. female with a hx of coronary artery disease seen on coronary CTA, heart failure with depressed ejection fraction, hypertension, hyperlipidemia, smoker, frequent PACs, nonsustained ventricular tachycardia.  I saw the patient on February 18, 2020 at that we discussed her stress test results showed no evidence of ischemia.  She still was experiencing chest pain which had resolved with nitroglycerin.      I saw the patient March 19, 2020 we discussed her results from her Zio monitor which showedshowed evidence of NSVT, paroxysmal SVT likely atrial tachycardia -there is also a 3 second sinus pause and frequent PACs.Marland Kitchen during that visit I recommended the patient to see EP as well as we kept her beta-blocker dose the same.  She was able to see EP in the interim and at this time no intervention is planned watchful waiting.   I saw the patient on June 26, 2020 at that time she was not experiencing any chest pain.  She tells me that she was doing well from a cardiovascular standpoint.  Since her last visit she has had a hospitalization at [facility] where she had endoscopy showing gastritis.  She has follow-up planned with GI.   I saw the patient December 23, 2020 at that time she reported she was experiencing intermittent chest discomfort.  Sent her for coronary CTA which reported moderate nonobstructive coronary artery disease.  Past Medical History:  Diagnosis Date   Acute gastric ulcer with perforation (HCC) 06/05/2019   Congestive heart disease (HCC)    COPD (chronic obstructive pulmonary disease) (HCC)    Depressed left ventricular ejection fraction 02/18/2020   Encounter for  screening colonoscopy 12/25/2019   Essential hypertension 01/18/2020   Hypertension    Mixed hyperlipidemia 01/18/2020   NSVT (nonsustained ventricular tachycardia) (HCC) 03/19/2020   Other chest pain 01/18/2020   PAC (premature atrial contraction) 03/19/2020   Paroxysmal SVT (supraventricular tachycardia) (HCC) 03/19/2020   Postoperative examination 06/05/2019   Preoperative clearance 01/18/2020   PVC (premature ventricular contraction) 01/18/2020   Sinus pause 03/19/2020   Smoker 01/18/2020    Past Surgical History:  Procedure Laterality Date   APPENDECTOMY     STOMACH SURGERY     Perforated gastric ulcer    Current Medications: No outpatient medications have been marked as taking for the 04/21/21 encounter (Appointment) with Thomasene Ripple, DO.     Allergies:   Amoxicillin and Tramadol   Social History   Socioeconomic History   Marital status: Unknown    Spouse name: Not on file   Number of children: Not on file   Years of education: Not on file   Highest education level: Not on file  Occupational History   Not on file  Tobacco Use   Smoking status: Every Day   Smokeless tobacco: Never  Substance and Sexual Activity   Alcohol use: Never   Drug use: Never   Sexual activity: Not on file  Other Topics Concern   Not on file  Social History Narrative   Not on file   Social Determinants of Health   Financial Resource Strain: Not on file  Food Insecurity:  Not on file  Transportation Needs: Not on file  Physical Activity: Not on file  Stress: Not on file  Social Connections: Not on file     Family History: The patient's family history includes Congestive Heart Failure in her father; Diabetes in her brother, father, and mother; Heart disease in her brother; Hypertension in her brother, father, and mother; Stroke in her brother.  ROS:   Review of Systems  Constitution: Negative for decreased appetite, fever and weight gain.  HENT: Negative for congestion, ear discharge, hoarse voice  and sore throat.   Eyes: Negative for discharge, redness, vision loss in right eye and visual halos.  Cardiovascular: Negative for chest pain, dyspnea on exertion, leg swelling, orthopnea and palpitations.  Respiratory: Negative for cough, hemoptysis, shortness of breath and snoring.   Endocrine: Negative for heat intolerance and polyphagia.  Hematologic/Lymphatic: Negative for bleeding problem. Does not bruise/bleed easily.  Skin: Negative for flushing, nail changes, rash and suspicious lesions.  Musculoskeletal: Negative for arthritis, joint pain, muscle cramps, myalgias, neck pain and stiffness.  Gastrointestinal: Negative for abdominal pain, bowel incontinence, diarrhea and excessive appetite.  Genitourinary: Negative for decreased libido, genital sores and incomplete emptying.  Neurological: Negative for brief paralysis, focal weakness, headaches and loss of balance.  Psychiatric/Behavioral: Negative for altered mental status, depression and suicidal ideas.  Allergic/Immunologic: Negative for HIV exposure and persistent infections.    EKGs/Labs/Other Studies Reviewed:    The following studies were reviewed today:   EKG:  The ekg ordered today demonstrates   CCTA 01/09/2021 Aorta:  Normal size.  No calcifications.  No dissection.   Aortic Valve:  Trileaflet.  No calcifications.   Coronary Arteries:  Normal coronary origin.  Right dominance.   RCA is a large dominant artery that gives rise to PDA and PLA. There is minimal (<24%) soft plaque in the proximal RCA. The mid RCA with minimal calcified plaque. The distal RCA with no plaques.   Left main is a large artery that gives rise to LAD and LCX arteries. The distal Left main with mild (25-49%) calcified plaque.   LAD is a large vessel. The ostial LAD with mild calcified plaque. The mid LAD with moderate (50-69%) calcified plaque. The distal LAD with no plaque. D1 is a medium sized vessel with no plaques.   LCX is a  non-dominant artery that gives rise to 3 small OM branches. There ostial LCX with mild calcified plaque. OM1 with proximal mild mixed plaque. OM2 and OM3 with no plaques.   Other findings:   Normal pulmonary vein drainage into the left atrium.   Normal left atrial appendage without a thrombus.   Normal size of the pulmonary artery.   IMPRESSION: 1. Coronary calcium score of 218. This was 58 percentile for age and sex matched control.   2. Normal coronary origin with right dominance.   3 Moderate CAD. CAD-RADS 3. Consider symptom-guided anti-ischemic pharmacotherapy as well as risk factor modification per guideline directed care. Additional analysis with CT FFR will be submitted.     Electronically Signed   By: Thomasene Ripple DO  Zio monitor  The patient wore the monitor for 13 days 8 hours starting Jan 18, 2020. Indication: PVC The minimum heart rate was 48 bpm, maximum heart rate was 184  bpm, and average heart rate was 73  bpm. Predominant underlying rhythm was Sinus Rhythm. First Degree AV Block was present. Bundle Branch Block/IVCD was present.   1 run of Ventricular Tachycardia occurred lasting 6 beats  with a maximum rate of 184 bpm (average 170 bpm).   23 Supraventricular Tachycardia runs occurred, the run with the fastest interval lasting 16 beats with a maximum heart rate of 136 bpm (average 119 bpm); the run with the fastest interval was also the longest.   1 Pause occurred lasting 3.1 seconds (20 bpm). (5:30 am on 01/19/20).   Premature atrial complexes were frequent (8.0%, [id number]).  Premature Ventricular complexes were rare (<1.0%, 20).   No atrial fibrillation present. 5 patient triggered events and 5 diary events, all associated with premature atrial complexes.   Conclusion: This study is remarkable for the following:                             1. Sinus pause lasting 3.1 seconds (20 bpm) (5:30 am on 01/19/20).                             2. Nonsustained  Ventricular tachycardia (1 run with 6 beats).                             3. Paroxysmal supraventricular tachycardia,                                        4. Frequent Premature atrial complexes (8.0%, [id number]).     Pharmacologic nuclear stress test in May 2021 The left ventricular ejection fraction is mildly decreased (45-54%). Nuclear stress EF: 45%. There was no ST segment deviation noted during stress. No evidence of ischemia. Soft tissue attenuation in the inferior segment of the LV.    Recent Labs: 12/23/2020: Hemoglobin 13.0; Magnesium 2.0; Platelets 263 01/08/2021: BUN 32; Creatinine, Ser 1.60; Potassium 4.4; Sodium 141  Recent Lipid Panel No results found for: CHOL, TRIG, HDL, CHOLHDL, VLDL, LDLCALC, LDLDIRECT  Physical Exam:    VS:  There were no vitals taken for this visit.    Wt Readings from Last 3 Encounters:  01/08/21 128 lb (58.1 kg)  12/23/20 129 lb 6.4 oz (58.7 kg)  06/26/20 139 lb (63 kg)     GEN: Well nourished, well developed in no acute distress HEENT: Normal NECK: No JVD; No carotid bruits LYMPHATICS: No lymphadenopathy CARDIAC: S1S2 noted,RRR, no murmurs, rubs, gallops RESPIRATORY:  Clear to auscultation without rales, wheezing or rhonchi  ABDOMEN: Soft, non-tender, non-distended, +bowel sounds, no guarding. EXTREMITIES: No edema, No cyanosis, no clubbing MUSCULOSKELETAL:  No deformity  SKIN: Warm and dry NEUROLOGIC:  Alert and oriented x 3, non-focal PSYCHIATRIC:  Normal affect, good insight  ASSESSMENT:    1. CAD in native artery   2. PVC (premature ventricular contraction)   3. Hypertension, unspecified type   4. PAC (premature atrial contraction)   5. NSVT (nonsustained ventricular tachycardia) (HCC)    PLAN:     1.  The patient is in agreement with the above plan. The patient left the office in stable condition.  The patient will follow up in   Medication Adjustments/Labs and Tests Ordered: Current medicines are reviewed at length  with the patient today.  Concerns regarding medicines are outlined above.  No orders of the defined types were placed in this encounter.  No orders of the defined types were placed in this encounter.   There are no Patient  Instructions on file for this visit.   Adopting a Healthy Lifestyle.  Know what a healthy weight is for you (roughly BMI <25) and aim to maintain this   Aim for 7+ servings of fruits and vegetables daily   65-80+ fluid ounces of water or unsweet tea for healthy kidneys   Limit to max 1 drink of alcohol per day; avoid smoking/tobacco   Limit animal fats in diet for cholesterol and heart health - choose grass fed whenever available   Avoid highly processed foods, and foods high in saturated/trans fats   Aim for low stress - take time to unwind and care for your mental health   Aim for 150 min of moderate intensity exercise weekly for heart health, and weights twice weekly for bone health   Aim for 7-9 hours of sleep daily   When it comes to diets, agreement about the perfect plan isnt easy to find, even among the experts. Experts at the [facility] of [employer] developed an idea known as the Healthy Eating Plate. Just imagine a plate divided into logical, healthy portions.   The emphasis is on diet quality:   Load up on vegetables and fruits - one-half of your plate: Aim for color and variety, and remember that potatoes dont count.   Go for whole grains - one-quarter of your plate: Whole wheat, barley, wheat berries, quinoa, oats, brown rice, and foods made with them. If you want pasta, go with whole wheat pasta.   Protein power - one-quarter of your plate: Fish, chicken, beans, and nuts are all healthy, versatile protein sources. Limit red meat.   The diet, however, does go beyond the plate, offering a few other suggestions.   Use healthy plant oils, such as olive, canola, soy, corn, sunflower and peanut. Check the labels, and avoid partially  hydrogenated oil, which have unhealthy trans fats.   If youre thirsty, drink water. Coffee and tea are good in moderation, but skip sugary drinks and limit milk and dairy products to one or two daily servings.   The type of carbohydrate in the diet is more important than the amount. Some sources of carbohydrates, such as vegetables, fruits, whole grains, and beans-are healthier than others.   Finally, stay active  Signed, Thomasene Ripple, DO  04/21/2021 11:51 AM    [facility]

## 2021-04-22 ENCOUNTER — Encounter: Payer: Self-pay | Admitting: Cardiology

## 2021-08-05 ENCOUNTER — Ambulatory Visit: Payer: Medicaid Other | Admitting: Cardiology

## 2021-10-08 ENCOUNTER — Other Ambulatory Visit: Payer: Self-pay

## 2021-10-13 ENCOUNTER — Ambulatory Visit (INDEPENDENT_AMBULATORY_CARE_PROVIDER_SITE_OTHER): Payer: Medicaid Other | Admitting: Cardiology

## 2021-10-13 ENCOUNTER — Other Ambulatory Visit: Payer: Self-pay

## 2021-10-13 ENCOUNTER — Encounter: Payer: Self-pay | Admitting: Cardiology

## 2021-10-13 VITALS — BP 112/70 | HR 87 | Ht 63.0 in | Wt 118.8 lb

## 2021-10-13 DIAGNOSIS — I4729 Other ventricular tachycardia: Secondary | ICD-10-CM

## 2021-10-13 DIAGNOSIS — I471 Supraventricular tachycardia: Secondary | ICD-10-CM | POA: Diagnosis not present

## 2021-10-13 DIAGNOSIS — R0600 Dyspnea, unspecified: Secondary | ICD-10-CM

## 2021-10-13 DIAGNOSIS — E782 Mixed hyperlipidemia: Secondary | ICD-10-CM

## 2021-10-13 DIAGNOSIS — R0989 Other specified symptoms and signs involving the circulatory and respiratory systems: Secondary | ICD-10-CM

## 2021-10-13 DIAGNOSIS — I428 Other cardiomyopathies: Secondary | ICD-10-CM | POA: Insufficient documentation

## 2021-10-13 DIAGNOSIS — F17200 Nicotine dependence, unspecified, uncomplicated: Secondary | ICD-10-CM

## 2021-10-13 DIAGNOSIS — I455 Other specified heart block: Secondary | ICD-10-CM

## 2021-10-13 HISTORY — DX: Other cardiomyopathies: I42.8

## 2021-10-13 NOTE — Progress Notes (Signed)
Cardiology Office Note:    Date:  10/13/2021   ID:  Taylor Paul, DOB [birth year], MRN [id number]  PCP:  Kendra Opitz, NP  Cardiologist:  Garwin Brothers, MD   Referring MD: Kendra Opitz, NP    ASSESSMENT:    1. Sinus pause   2. Paroxysmal SVT (supraventricular tachycardia) (HCC)   3. Mixed hyperlipidemia   4. NSVT (nonsustained ventricular tachycardia)   5. Dyspnea, unspecified type   6. Depressed left ventricular ejection fraction   7. Smoker    PLAN:    In order of problems listed above:  Coronary artery disease: Nonobstructive in nature: Secondary prevention stressed with the patient.  Importance of compliance with diet medication stressed and she vocalized understanding. Cigarette smoker: I spent 5 minutes with the patient discussing solely about smoking. Smoking cessation was counseled. I suggested to the patient also different medications and pharmacological interventions. Patient is keen to try stopping on its own at this time. He will get back to me if he needs any further assistance in this matter. Essential hypertension: Blood pressure stable and diet was emphasized. Cardiomyopathy: Echo will get an echocardiogram to assess left medical systolic function.  It has been mildly depressed in the past.  I am not keen on adding any medications at this time because of borderline blood pressure and fear of hypotension. Mixed dyslipidemia: On statin therapy managed by primary care.  She had blood work yesterday stable and we will try to get a copy of those records.Patient will be seen in follow-up appointment in 6 months or earlier if the patient has any concerns    Medication Adjustments/Labs and Tests Ordered: Current medicines are reviewed at length with the patient today.  Concerns regarding medicines are outlined above.  Orders Placed This Encounter  Procedures   EKG 12-Lead   ECHOCARDIOGRAM COMPLETE   No orders of the defined types were placed in this  encounter.    No chief complaint on file.    History of Present Illness:    Taylor Paul is a 66 y.o. female.  Patient has past medical history of nonobstructive coronary artery disease, paroxysmal supraventricular tachycardia, nonsustained ventricular tachycardia, mixed dyslipidemia, essential hypertension COPD on oxygen and continues to smoke unfortunately.  She is previously unknown to me.  She has seen my partner in the past.  She denies any chest pain orthopnea or PND.  At the time of my evaluation, the patient is alert awake oriented and in no distress.  Past Medical History:  Diagnosis Date   Acute gastric ulcer with perforation (HCC) 06/05/2019   Chest discomfort 12/23/2020   Congestive heart disease (HCC)    COPD (chronic obstructive pulmonary disease) (HCC)    Depressed left ventricular ejection fraction 02/18/2020   Encounter for screening colonoscopy 12/25/2019   Essential hypertension 01/18/2020   Hypertension    Mixed hyperlipidemia 01/18/2020   NSVT (nonsustained ventricular tachycardia) 03/19/2020   Other chest pain 01/18/2020   PAC (premature atrial contraction) 03/19/2020   Paroxysmal SVT (supraventricular tachycardia) (HCC) 03/19/2020   Postoperative examination 06/05/2019   Preoperative clearance 01/18/2020   PVC (premature ventricular contraction) 01/18/2020   Shortness of breath 12/23/2020   Sinus pause 03/19/2020   Smoker 01/18/2020    Past Surgical History:  Procedure Laterality Date   APPENDECTOMY     STOMACH SURGERY     Perforated gastric ulcer    Current Medications: Current Meds  Medication Sig   aspirin EC 81 MG tablet Take 81  mg by mouth daily.   atorvastatin (LIPITOR) 20 MG tablet Take 20 mg by mouth daily.   busPIRone (BUSPAR) 5 MG tablet Take 5 mg by mouth daily.   carvedilol (COREG) 6.25 MG tablet TAKE 1 TABLET BY MOUTH 2 TIMES DAILY   Dextromethorphan HBr (DELSYM PO) Take 5 mg by mouth as needed (cough).   fluticasone (FLONASE) 50 MCG/ACT nasal spray Place 1  spray into both nostrils as needed for allergies or rhinitis.   Fluticasone-Umeclidin-Vilant (TRELEGY ELLIPTA IN) Inhale 1 puff into the lungs daily.   furosemide (LASIX) 40 MG tablet Take 40 mg by mouth daily.   guaiFENesin (MUCINEX PO) Take 1 tablet by mouth 2 (two) times daily.   lisinopril (ZESTRIL) 20 MG tablet Take 20 mg by mouth daily.   montelukast (SINGULAIR) 10 MG tablet Take 1 tablet by mouth daily.   nitroGLYCERIN (NITROSTAT) 0.4 MG SL tablet DISSOLVE 1 TABLET UNDER TONGUE EVERY 5 MINUTES AS NEEDED FOR CHEST PAIN. IF NO RELIEF AFTER 3 DOSES CALL 911.   pantoprazole (PROTONIX) 40 MG tablet Take 1 tablet by mouth 2 (two) times daily.   potassium chloride (KLOR-CON) 20 MEQ packet Take 20 mEq by mouth daily.   SYMBICORT 160-4.5 MCG/ACT inhaler Inhale 2 puffs into the lungs in the morning and at bedtime.     Allergies:   Amoxicillin and Tramadol   Social History   Socioeconomic History   Marital status: Unknown    Spouse name: Not on file   Number of children: Not on file   Years of education: Not on file   Highest education level: Not on file  Occupational History   Not on file  Tobacco Use   Smoking status: Every Day   Smokeless tobacco: Never  Substance and Sexual Activity   Alcohol use: Never   Drug use: Never   Sexual activity: Not on file  Other Topics Concern   Not on file  Social History Narrative   Not on file   Social Determinants of Health   Financial Resource Strain: Not on file  Food Insecurity: Not on file  Transportation Needs: Not on file  Physical Activity: Not on file  Stress: Not on file  Social Connections: Not on file     Family History: The patient's family history includes Congestive Heart Failure in her father; Diabetes in her brother, father, and mother; Heart disease in her brother; Hypertension in her brother, father, and mother; Stroke in her brother.  ROS:   Please see the history of present illness.    All other systems reviewed  and are negative.  EKGs/Labs/Other Studies Reviewed:    The following studies were reviewed today: I discussed my findings with the patient at length   Recent Labs: 12/23/2020: Hemoglobin 13.0; Magnesium 2.0; Platelets 263 01/08/2021: BUN 32; Creatinine, Ser 1.60; Potassium 4.4; Sodium 141  Recent Lipid Panel No results found for: CHOL, TRIG, HDL, CHOLHDL, VLDL, LDLCALC, LDLDIRECT  Physical Exam:    VS:  BP 112/70    Pulse 87    Ht 5\' 3"  (1.6 m)    Wt 118 lb 12.8 oz (53.9 kg)    SpO2 94%    BMI 21.04 kg/m     Wt Readings from Last 3 Encounters:  10/13/21 118 lb 12.8 oz (53.9 kg)  01/08/21 128 lb (58.1 kg)  12/23/20 129 lb 6.4 oz (58.7 kg)     GEN: Patient is in no acute distress HEENT: Normal NECK: No JVD; No carotid bruits LYMPHATICS: No  lymphadenopathy CARDIAC: Hear sounds regular, 2/6 systolic murmur at the apex. RESPIRATORY:  Clear to auscultation without rales, wheezing or rhonchi  ABDOMEN: Soft, non-tender, non-distended MUSCULOSKELETAL:  No edema; No deformity  SKIN: Warm and dry NEUROLOGIC:  Alert and oriented x 3 PSYCHIATRIC:  Normal affect   Signed, Jenean Lindau, MD  10/13/2021 2:37 PM    [place]

## 2021-10-13 NOTE — Patient Instructions (Signed)
Medication Instructions:  °Your physician recommends that you continue on your current medications as directed. Please refer to the Current Medication list given to you today. ° °*If you need a refill on your cardiac medications before your next appointment, please call your pharmacy* ° ° °Lab Work: °None ordered °If you have labs (blood work) drawn today and your tests are completely normal, you will receive your results only by: °MyChart Message (if you have MyChart) OR °A paper copy in the mail °If you have any lab test that is abnormal or we need to change your treatment, we will call you to review the results. ° ° °Testing/Procedures: °Your physician has requested that you have an echocardiogram. Echocardiography is a painless test that uses sound waves to create images of your heart. It provides your doctor with information about the size and shape of your heart and how well your heart’s chambers and valves are working. This procedure takes approximately one hour. There are no restrictions for this procedure. ° ° ° °Follow-Up: °At [facility], you and your health needs are our priority.  As part of our continuing mission to provide you with exceptional heart care, we have created designated Provider Care Teams.  These Care Teams include your primary Cardiologist (physician) and Advanced Practice Providers (APPs -  Physician Assistants and Nurse Practitioners) who all work together to provide you with the care you need, when you need it. ° °We recommend signing up for the patient portal called "MyChart".  Sign up information is provided on this After Visit Summary.  MyChart is used to connect with patients for Virtual Visits (Telemedicine).  Patients are able to view lab/test results, encounter notes, upcoming appointments, etc.  Non-urgent messages can be sent to your provider as well.   °To learn more about what you can do with MyChart, go to [email or web address].   ° °Your next appointment:   °6  month(s) ° °The format for your next appointment:   °In Person ° °Provider:   °Rajan Revankar, MD ° ° °Other Instructions °Echocardiogram °An echocardiogram is a test that uses sound waves (ultrasound) to produce images of the heart. °Images from an echocardiogram can provide important information about: °Heart size and shape. °The size and thickness and movement of your heart's walls. °Heart muscle function and strength. °Heart valve function or if you have stenosis. Stenosis is when the heart valves are too narrow. °If blood is flowing backward through the heart valves (regurgitation). °A tumor or infectious growth around the heart valves. °Areas of heart muscle that are not working well because of poor blood flow or injury from a heart attack. °Aneurysm detection. An aneurysm is a weak or damaged part of an artery wall. The wall bulges out from the normal force of blood pumping through the body. °Tell a health care provider about: °Any allergies you have. °All medicines you are taking, including vitamins, herbs, eye drops, creams, and over-the-counter medicines. °Any blood disorders you have. °Any surgeries you have had. °Any medical conditions you have. °Whether you are pregnant or may be pregnant. °What are the risks? °Generally, this is a safe test. However, problems may occur, including an allergic reaction to dye (contrast) that may be used during the test. °What happens before the test? °No specific preparation is needed. You may eat and drink normally. °What happens during the test? °You will take off your clothes from the waist up and put on a hospital gown. °Electrodes or electrocardiogram (ECG)patches may be placed on   your chest. The electrodes or patches are then connected to a device that monitors your heart rate and rhythm. °You will lie down on a table for an ultrasound exam. A gel will be applied to your chest to help sound waves pass through your skin. °A handheld device, called a transducer, will  be pressed against your chest and moved over your heart. The transducer produces sound waves that travel to your heart and bounce back (or "echo" back) to the transducer. These sound waves will be captured in real-time and changed into images of your heart that can be viewed on a video monitor. The images will be recorded on a computer and reviewed by your health care provider. °You may be asked to change positions or hold your breath for a short time. This makes it easier to get different views or better views of your heart. °In some cases, you may receive contrast through an IV in one of your veins. This can improve the quality of the pictures from your heart. °The procedure may vary among health care providers and hospitals.   °What can I expect after the test? °You may return to your normal, everyday life, including diet, activities, and medicines, unless your health care provider tells you not to do that. °Follow these instructions at home: °It is up to you to get the results of your test. Ask your health care provider, or the department that is doing the test, when your results will be ready. °Keep all follow-up visits. This is important. °Summary °An echocardiogram is a test that uses sound waves (ultrasound) to produce images of the heart. °Images from an echocardiogram can provide important information about the size and shape of your heart, heart muscle function, heart valve function, and other possible heart problems. °You do not need to do anything to prepare before this test. You may eat and drink normally. °After the echocardiogram is completed, you may return to your normal, everyday life, unless your health care provider tells you not to do that. °This information is not intended to replace advice given to you by your health care provider. Make sure you discuss any questions you have with your health care provider. °Document Revised: 04/22/2020 Document Reviewed: 04/22/2020 °Elsevier Patient  Education © 2021 [employer] ° ° °

## 2021-11-23 ENCOUNTER — Ambulatory Visit (INDEPENDENT_AMBULATORY_CARE_PROVIDER_SITE_OTHER): Payer: Medicaid Other

## 2021-11-23 ENCOUNTER — Other Ambulatory Visit: Payer: Self-pay

## 2021-11-23 DIAGNOSIS — R0600 Dyspnea, unspecified: Secondary | ICD-10-CM | POA: Diagnosis not present

## 2021-11-23 DIAGNOSIS — I471 Supraventricular tachycardia: Secondary | ICD-10-CM | POA: Diagnosis not present

## 2021-11-23 LAB — ECHOCARDIOGRAM COMPLETE
Area-P 1/2: 3.89 cm2
S' Lateral: 2.4 cm

## 2021-12-10 ENCOUNTER — Telehealth: Payer: Self-pay | Admitting: Cardiology

## 2021-12-10 NOTE — Telephone Encounter (Signed)
Calling in to see if we can fax the medical release form to there office to get the patient to sign it. Please advise   ? ?fax ?[phone] ?

## 2022-04-27 ENCOUNTER — Ambulatory Visit (INDEPENDENT_AMBULATORY_CARE_PROVIDER_SITE_OTHER): Payer: Medicaid Other | Admitting: Cardiology

## 2022-04-27 ENCOUNTER — Encounter: Payer: Self-pay | Admitting: Cardiology

## 2022-04-27 VITALS — BP 106/70 | HR 68 | Ht 63.0 in | Wt 113.0 lb

## 2022-04-27 DIAGNOSIS — I4729 Other ventricular tachycardia: Secondary | ICD-10-CM | POA: Diagnosis not present

## 2022-04-27 DIAGNOSIS — I2510 Atherosclerotic heart disease of native coronary artery without angina pectoris: Secondary | ICD-10-CM

## 2022-04-27 DIAGNOSIS — I10 Essential (primary) hypertension: Secondary | ICD-10-CM

## 2022-04-27 DIAGNOSIS — E782 Mixed hyperlipidemia: Secondary | ICD-10-CM

## 2022-04-27 DIAGNOSIS — F17200 Nicotine dependence, unspecified, uncomplicated: Secondary | ICD-10-CM

## 2022-04-27 DIAGNOSIS — F17210 Nicotine dependence, cigarettes, uncomplicated: Secondary | ICD-10-CM | POA: Diagnosis not present

## 2022-04-27 HISTORY — DX: Atherosclerotic heart disease of native coronary artery without angina pectoris: I25.10

## 2022-04-27 NOTE — Progress Notes (Signed)
Cardiology Office Note:    Date:  04/27/2022   ID:  MAHATI VAJDA, DOB 07-12-1956, MRN [id number]  PCP:  [facility]  Cardiologist:  Garwin Brothers, MD   Referring MD: Kendra Opitz, NP    ASSESSMENT:    1. Essential hypertension   2. Coronary artery disease involving native coronary artery of native heart without angina pectoris   3. Mixed hyperlipidemia   4. NSVT (nonsustained ventricular tachycardia) (HCC)   5. Smoker    PLAN:    In order of problems listed above:  Coronary artery disease: Secondary prevention stressed with the patient.  Importance of compliance with diet medication stressed and she vocalized understanding.  She was advised to be active and ambulatory to the best of her ability. Patient with hypertension: Blood pressure stable and diet was emphasized. Mixed dyslipidemia: Lipids followed by primary care.  I told her to get blood work done if stable and send [place] a copy. History of cardiomyopathy: Ejection fraction is normal and I discussed the last echo report with her at length. Cigarette smoker: I spent 5 minutes with the patient discussing solely about smoking. Smoking cessation was counseled. I suggested to the patient also different medications and pharmacological interventions. Patient is keen to try stopping on its own at this time. He will get back to me if he needs any further assistance in this matter. Patient will be seen in follow-up appointment in 6 months or earlier if the patient has any concerns    Medication Adjustments/Labs and Tests Ordered: Current medicines are reviewed at length with the patient today.  Concerns regarding medicines are outlined above.  No orders of the defined types were placed in this encounter.  No orders of the defined types were placed in this encounter.    Chief Complaint  Patient presents with   Follow-up     History of Present Illness:    Taylor Paul is a 66 y.o. female.  Patient has  past medical history of coronary artery disease, essential hypertension, mixed hyperlipidemia and nonsustained ventricular tachycardia.  Unfortunately she continues to smoke.  She denies any chest pain orthopnea or PND.  She tells me that she uses oxygen supplemental on a as needed basis.  At the time of my evaluation, the patient is alert awake oriented and in no distress.  Past Medical History:  Diagnosis Date   Acute gastric ulcer with perforation (HCC) 06/05/2019   Chest discomfort 12/23/2020   Congestive heart disease (HCC)    COPD (chronic obstructive pulmonary disease) (HCC)    Depressed left ventricular ejection fraction 02/18/2020   Encounter for screening colonoscopy 12/25/2019   Essential hypertension 01/18/2020   Hypertension    Mixed hyperlipidemia 01/18/2020   Nonischemic cardiomyopathy (HCC) 10/13/2021   NSVT (nonsustained ventricular tachycardia) (HCC) 03/19/2020   Other chest pain 01/18/2020   PAC (premature atrial contraction) 03/19/2020   Paroxysmal SVT (supraventricular tachycardia) (HCC) 03/19/2020   Postoperative examination 06/05/2019   Preoperative clearance 01/18/2020   PVC (premature ventricular contraction) 01/18/2020   Shortness of breath 12/23/2020   Sinus pause 03/19/2020   Smoker 01/18/2020    Past Surgical History:  Procedure Laterality Date   APPENDECTOMY     STOMACH SURGERY     Perforated gastric ulcer    Current Medications: Current Meds  Medication Sig   aspirin EC 81 MG tablet Take 81 mg by mouth daily.   atorvastatin (LIPITOR) 20 MG tablet Take 20 mg by mouth daily.  busPIRone (BUSPAR) 5 MG tablet Take 5 mg by mouth daily.   carvedilol (COREG) 6.25 MG tablet TAKE 1 TABLET BY MOUTH 2 TIMES DAILY   Dextromethorphan HBr (DELSYM PO) Take 5 mg by mouth as needed (cough).   fluticasone (FLONASE) 50 MCG/ACT nasal spray Place 1 spray into both nostrils as needed for allergies or rhinitis.   Fluticasone-Umeclidin-Vilant (TRELEGY ELLIPTA IN) Inhale 1 puff into the lungs  daily.   furosemide (LASIX) 40 MG tablet Take 40 mg by mouth daily.   guaiFENesin (MUCINEX PO) Take 1 tablet by mouth 2 (two) times daily.   lisinopril (ZESTRIL) 20 MG tablet Take 20 mg by mouth daily.   montelukast (SINGULAIR) 10 MG tablet Take 1 tablet by mouth daily.   nitroGLYCERIN (NITROSTAT) 0.4 MG SL tablet DISSOLVE 1 TABLET UNDER TONGUE EVERY 5 MINUTES AS NEEDED FOR CHEST PAIN. IF NO RELIEF AFTER 3 DOSES CALL 911.   pantoprazole (PROTONIX) 40 MG tablet Take 1 tablet by mouth 2 (two) times daily.   potassium chloride (KLOR-CON) 20 MEQ packet Take 20 mEq by mouth daily.   SYMBICORT 160-4.5 MCG/ACT inhaler Inhale 2 puffs into the lungs in the morning and at bedtime.     Allergies:   Amoxicillin and Tramadol   Social History   Socioeconomic History   Marital status: Single    Spouse name: Not on file   Number of children: Not on file   Years of education: Not on file   Highest education level: Not on file  Occupational History   Not on file  Tobacco Use   Smoking status: Every Day   Smokeless tobacco: Never  Substance and Sexual Activity   Alcohol use: Never   Drug use: Never   Sexual activity: Not on file  Other Topics Concern   Not on file  Social History Narrative   Not on file   Social Determinants of Health   Financial Resource Strain: Not on file  Food Insecurity: Not on file  Transportation Needs: Not on file  Physical Activity: Not on file  Stress: Not on file  Social Connections: Not on file     Family History: The patient's family history includes Congestive Heart Failure in her father; Diabetes in her brother, father, and mother; Heart disease in her brother; Hypertension in her brother, father, and mother; Stroke in her brother.  ROS:   Please see the history of present illness.    All other systems reviewed and are negative.  EKGs/Labs/Other Studies Reviewed:    The following studies were reviewed today: I discussed my findings with the patient  at length   Recent Labs: No results found for requested labs within last 365 days.  Recent Lipid Panel No results found for: "CHOL", "TRIG", "HDL", "CHOLHDL", "VLDL", "LDLCALC", "LDLDIRECT"  Physical Exam:    VS:  BP 106/70 (BP Location: Right Arm, Patient Position: Sitting, Cuff Size: Normal)   Pulse 68   Ht 5\' 3"  (1.6 m)   Wt 113 lb (51.3 kg)   SpO2 96%   BMI 20.02 kg/m     Wt Readings from Last 3 Encounters:  04/27/22 113 lb (51.3 kg)  10/13/21 118 lb 12.8 oz (53.9 kg)  01/08/21 128 lb (58.1 kg)     GEN: Patient is in no acute distress HEENT: Normal NECK: No JVD; No carotid bruits LYMPHATICS: No lymphadenopathy CARDIAC: Hear sounds regular, 2/6 systolic murmur at the apex. RESPIRATORY:  Clear to auscultation without rales, wheezing or rhonchi  ABDOMEN: Soft, non-tender,  non-distended MUSCULOSKELETAL:  No edema; No deformity  SKIN: Warm and dry NEUROLOGIC:  Alert and oriented x 3 PSYCHIATRIC:  Normal affect   Signed, Garwin Brothers, MD  04/27/2022 10:55 AM    [facility]

## 2022-04-27 NOTE — Patient Instructions (Signed)
Medication Instructions:  Your physician recommends that you continue on your current medications as directed. Please refer to the Current Medication list given to you today.  *If you need a refill on your cardiac medications before your next appointment, please call your pharmacy*   Lab Work: NONE If you have labs (blood work) drawn today and your tests are completely normal, you will receive your results only by: MyChart Message (if you have MyChart) OR A paper copy in the mail If you have any lab test that is abnormal or we need to change your treatment, we will call you to review the results.   Testing/Procedures: NONE   Follow-Up: At [facility], you and your health needs are our priority.  As part of our continuing mission to provide you with exceptional heart care, we have created designated Provider Care Teams.  These Care Teams include your primary Cardiologist (physician) and Advanced Practice Providers (APPs -  Physician Assistants and Nurse Practitioners) who all work together to provide you with the care you need, when you need it.  We recommend signing up for the patient portal called "MyChart".  Sign up information is provided on this After Visit Summary.  MyChart is used to connect with patients for Virtual Visits (Telemedicine).  Patients are able to view lab/test results, encounter notes, upcoming appointments, etc.  Non-urgent messages can be sent to your provider as well.   To learn more about what you can do with MyChart, go to [email or web address].    Your next appointment:   9 month(s)  The format for your next appointment:   In Person  Provider:   Rajan Revankar, MD    Other Instructions   Important Information About Sugar       

## 2022-07-28 DIAGNOSIS — R079 Chest pain, unspecified: Secondary | ICD-10-CM | POA: Diagnosis not present

## 2022-07-30 ENCOUNTER — Telehealth: Payer: Self-pay | Admitting: Cardiology

## 2022-07-30 NOTE — Telephone Encounter (Signed)
Taylor Paul with [facility] states the patient had a lexiscan on 11/15 and their provider spoke with Dr. Tomie China about it and advised to call the office and ask for [name] to coordinate a follow up appointment with him. Taylor Paul also mentions that she plans to fax a copy of the lexiscan and their notes to the office for further review.

## 2022-08-02 NOTE — Telephone Encounter (Signed)
Left VM for Lawson Fiscal to callback.

## 2022-08-03 NOTE — Telephone Encounter (Signed)
Appointment has been made with Lawson Fiscal at [facility] to discuss lexiscan.

## 2022-08-09 ENCOUNTER — Encounter: Payer: Self-pay | Admitting: Adult Health

## 2022-08-09 ENCOUNTER — Encounter: Payer: Self-pay | Admitting: Cardiology

## 2022-08-09 ENCOUNTER — Ambulatory Visit: Payer: Medicare (Managed Care) | Attending: Cardiology | Admitting: Cardiology

## 2022-08-09 VITALS — BP 118/62 | HR 97 | Ht 63.0 in | Wt 97.2 lb

## 2022-08-09 DIAGNOSIS — I200 Unstable angina: Secondary | ICD-10-CM | POA: Insufficient documentation

## 2022-08-09 DIAGNOSIS — E782 Mixed hyperlipidemia: Secondary | ICD-10-CM | POA: Diagnosis not present

## 2022-08-09 DIAGNOSIS — I10 Essential (primary) hypertension: Secondary | ICD-10-CM

## 2022-08-09 DIAGNOSIS — I209 Angina pectoris, unspecified: Secondary | ICD-10-CM

## 2022-08-09 DIAGNOSIS — I2510 Atherosclerotic heart disease of native coronary artery without angina pectoris: Secondary | ICD-10-CM

## 2022-08-09 DIAGNOSIS — I4729 Other ventricular tachycardia: Secondary | ICD-10-CM | POA: Diagnosis not present

## 2022-08-09 HISTORY — DX: Unstable angina: I20.0

## 2022-08-09 NOTE — Progress Notes (Signed)
Cardiology Office Note:    Date:  08/09/2022   ID:  Taylor Paul, DOB [birth year], MRN [id number]  PCP:  Care, [facility] Senior  Cardiologist:  Garwin Brothers, MD   Referring MD: Care, [facility] Senior    ASSESSMENT:    1. Coronary artery disease involving native coronary artery of native heart without angina pectoris   2. Essential hypertension   3. NSVT (nonsustained ventricular tachycardia) (HCC)   4. Mixed hyperlipidemia   5. Angina pectoris (HCC)    PLAN:    In order of problems listed above:  Coronary artery disease and angina pectoris: Secondary prevention stressed with the patient.  Importance of compliance with diet medication stressed and she vocalized understanding.  I advised her to take a coated baby aspirin on a daily basis.I discussed coronary angiography and left heart catheterization with the patient at extensive length. Procedure, benefits and potential risks were explained. Patient had multiple questions which were answered to the patient's satisfaction. Patient agreed and consented for the procedure. Further recommendations will be made based on the findings of the coronary angiography. In the interim. The patient has any significant symptoms he knows to go to the nearest emergency room. Cigarette smoker: I spent 5 minutes with the patient discussing solely about smoking. Smoking cessation was counseled. I suggested to the patient also different medications and pharmacological interventions. Patient is keen to try stopping on its own at this time. He will get back to me if he needs any further assistance in this matter. Mixed dyslipidemia: On lipid-lowering medications.  Lipids reviewed diet emphasized. Essential hypertension: Blood pressure is stable and diet was emphasized.  Lifestyle modification urged.  She will be seen in follow-up appointment after the coronary angiography.   Medication Adjustments/Labs and Tests Ordered: Current medicines are reviewed at  length with the patient today.  Concerns regarding medicines are outlined above.  No orders of the defined types were placed in this encounter.  No orders of the defined types were placed in this encounter.    No chief complaint on file.    History of Present Illness:    Taylor Paul is a 66 y.o. female.  Patient has past medical history of coronary artery disease by CT coronary angiography in the past, essential hypertension, mixed dyslipidemia, COPD and unfortunately she continues to smoke.  She had a stress test at the hospital which was abnormal.  More importantly her story is of chest tightness on exertion when she walks her dog or steps up to put her garbage out.  No orthopnea or PND.  This chest tightness limits her effort and relief with nitroglycerin.  At the time of my evaluation, the patient is alert awake oriented and in no distress.  Past Medical History:  Diagnosis Date   Acute gastric ulcer with perforation (HCC) 06/05/2019   Chest discomfort 12/23/2020   Congestive heart disease (HCC)    COPD (chronic obstructive pulmonary disease) (HCC)    Coronary artery disease 04/27/2022   Depressed left ventricular ejection fraction 02/18/2020   Encounter for screening colonoscopy 12/25/2019   Essential hypertension 01/18/2020   Hypertension    Mixed hyperlipidemia 01/18/2020   Nonischemic cardiomyopathy (HCC) 10/13/2021   NSVT (nonsustained ventricular tachycardia) (HCC) 03/19/2020   Other chest pain 01/18/2020   PAC (premature atrial contraction) 03/19/2020   Paroxysmal SVT (supraventricular tachycardia) 03/19/2020   Postoperative examination 06/05/2019   Preoperative clearance 01/18/2020   PVC (premature ventricular contraction) 01/18/2020   Shortness of breath 12/23/2020  Sinus pause 03/19/2020   Smoker 01/18/2020    Past Surgical History:  Procedure Laterality Date   APPENDECTOMY     STOMACH SURGERY     Perforated gastric ulcer    Current  Medications: Current Meds  Medication Sig   aspirin EC 81 MG tablet Take 81 mg by mouth daily.   atorvastatin (LIPITOR) 20 MG tablet Take 20 mg by mouth daily.   busPIRone (BUSPAR) 5 MG tablet Take 5 mg by mouth daily.   carvedilol (COREG) 6.25 MG tablet TAKE 1 TABLET BY MOUTH 2 TIMES DAILY   Dextromethorphan HBr (DELSYM PO) Take 5 mg by mouth as needed (cough).   fluticasone (FLONASE) 50 MCG/ACT nasal spray Place 1 spray into both nostrils as needed for allergies or rhinitis.   Fluticasone-Umeclidin-Vilant (TRELEGY ELLIPTA IN) Inhale 1 puff into the lungs daily.   furosemide (LASIX) 40 MG tablet Take 40 mg by mouth daily.   guaiFENesin (MUCINEX PO) Take 1 tablet by mouth 2 (two) times daily.   lisinopril (ZESTRIL) 20 MG tablet Take 20 mg by mouth daily.   montelukast (SINGULAIR) 10 MG tablet Take 1 tablet by mouth daily.   nitroGLYCERIN (NITROSTAT) 0.4 MG SL tablet DISSOLVE 1 TABLET UNDER TONGUE EVERY 5 MINUTES AS NEEDED FOR CHEST PAIN. IF NO RELIEF AFTER 3 DOSES CALL 911.   pantoprazole (PROTONIX) 40 MG tablet Take 1 tablet by mouth 2 (two) times daily.     Allergies:   Amoxicillin and Tramadol   Social History   Socioeconomic History   Marital status: Single    Spouse name: Not on file   Number of children: Not on file   Years of education: Not on file   Highest education level: Not on file  Occupational History   Not on file  Tobacco Use   Smoking status: Every Day   Smokeless tobacco: Never  Substance and Sexual Activity   Alcohol use: Never   Drug use: Never   Sexual activity: Not on file  Other Topics Concern   Not on file  Social History Narrative   Not on file   Social Determinants of Health   Financial Resource Strain: Not on file  Food Insecurity: Not on file  Transportation Needs: Not on file  Physical Activity: Not on file  Stress: Not on file  Social Connections: Not on file     Family History: The patient's family history includes Congestive Heart  Failure in her father; Diabetes in her brother, father, and mother; Heart disease in her brother; Hypertension in her brother, father, and mother; Stroke in her brother.  ROS:   Please see the history of present illness.    All other systems reviewed and are negative.  EKGs/Labs/Other Studies Reviewed:    The following studies were reviewed today: EKG reveals sinus rhythm anteroseptal infarction and biatrial enlargement and ST depressions in the inferior leads.   Recent Labs: No results found for requested labs within last 365 days.  Recent Lipid Panel No results found for: "CHOL", "TRIG", "HDL", "CHOLHDL", "VLDL", "LDLCALC", "LDLDIRECT"  Physical Exam:    VS:  BP 118/62   Pulse 97   Ht 5\' 3"  (1.6 m)   Wt 97 lb 3.2 oz (44.1 kg)   SpO2 96%   BMI 17.22 kg/m     Wt Readings from Last 3 Encounters:  08/09/22 97 lb 3.2 oz (44.1 kg)  04/27/22 113 lb (51.3 kg)  10/13/21 118 lb 12.8 oz (53.9 kg)     GEN: Patient  is in no acute distress HEENT: Normal NECK: No JVD; No carotid bruits LYMPHATICS: No lymphadenopathy CARDIAC: Hear sounds regular, 2/6 systolic murmur at the apex. RESPIRATORY:  Clear to auscultation without rales, wheezing or rhonchi  ABDOMEN: Soft, non-tender, non-distended MUSCULOSKELETAL:  No edema; No deformity  SKIN: Warm and dry NEUROLOGIC:  Alert and oriented x 3 PSYCHIATRIC:  Normal affect   Signed, Garwin Brothers, MD  08/09/2022 8:54 AM    [facility]

## 2022-08-09 NOTE — Patient Instructions (Signed)
Medication Instructions:  Your physician recommends that you continue on your current medications as directed. Please refer to the Current Medication list given to you today.   *If you need a refill on your cardiac medications before your next appointment, please call your pharmacy*   Lab Work: Your physician recommends that you have a BMET and CBC today in the office for your upcoming procedure.  If you have labs (blood work) drawn today and your tests are completely normal, you will receive your results only by: MyChart Message (if you have MyChart) OR A paper copy in the mail If you have any lab test that is abnormal or we need to change your treatment, we will call you to review the results.   Testing/Procedures:       Cardiac/Peripheral Catheterization   You are scheduled for a Cardiac Catheterization on Friday, December 1 with Dr. Verdis Prime.  1. Please arrive at the Main Entrance A at [facility]: [address] [place], [place] [address] on December 1 at 8:30 AM (This time is two hours before your procedure to ensure your preparation). Free valet parking service is available. You will check in at ADMITTING. The support person will be asked to wait in the waiting room.  It is OK to have someone drop you off and come back when you are ready to be discharged.        Special note: Every effort is made to have your procedure done on time. Please understand that emergencies sometimes delay scheduled procedures.   . 2. Diet: Do not eat solid foods after midnight.  You may have clear liquids until 5 AM the day of the procedure.  3. Labs: You had your labs done in the office at your visit.  4. Medication instructions in preparation for your procedure:   Contrast Allergy: No  Stop taking, Lisinopril (Zestril or Prinivil) Friday, December 1,, Lasix (Furosemide)  Friday, December 1,   On the morning of your procedure, take Aspirin 81 mg and any morning medicines NOT  listed above.  You may use sips of water.  5. Plan to go home the same day, you will only stay overnight if medically necessary. 6. You MUST have a responsible adult to drive you home. 7. An adult MUST be with you the first 24 hours after you arrive home. 8. Bring a current list of your medications, and the last time and date medication taken. 9. Bring ID and current insurance cards. 10.Please wear clothes that are easy to get on and off and wear slip-on shoes.  Thank you for allowing [place] to care for you!   -- [facility]    Follow-Up: At [facility], you and your health needs are our priority.  As part of our continuing mission to provide you with exceptional heart care, we have created designated Provider Care Teams.  These Care Teams include your primary Cardiologist (physician) and Advanced Practice Providers (APPs -  Physician Assistants and Nurse Practitioners) who all work together to provide you with the care you need, when you need it.  We recommend signing up for the patient portal called "MyChart".  Sign up information is provided on this After Visit Summary.  MyChart is used to connect with patients for Virtual Visits (Telemedicine).  Patients are able to view lab/test results, encounter notes, upcoming appointments, etc.  Non-urgent messages can be sent to your provider as well.   To learn more about what you can  do with MyChart, go to [email or web address].    Your next appointment:   1 month(s)  The format for your next appointment:   In Person  Provider:   Jyl Heinz, MD   Other Instructions  Coronary Angiogram With Stent Coronary angiogram with stent placement is a procedure to widen or open a narrow blood vessel of the heart (coronary artery). Arteries may become blocked by cholesterol buildup (plaques) in the lining of the artery wall. When a coronary artery becomes partially blocked, blood flow to that area decreases. This  may lead to chest pain or a heart attack (myocardial infarction). A stent is a small piece of metal that looks like mesh or spring. Stent placement may be done as treatment after a heart attack, or to prevent a heart attack if a blocked artery is found by a coronary angiogram. Let your health care provider know about: Any allergies you have, including allergies to medicines or contrast dye. All medicines you are taking, including vitamins, herbs, eye drops, creams, and over-the-counter medicines. Any problems you or family members have had with anesthetic medicines. Any blood disorders you have. Any surgeries you have had. Any medical conditions you have, including kidney problems or kidney failure. Whether you are pregnant or may be pregnant. Whether you are breastfeeding. What are the risks? Generally, this is a safe procedure. However, serious problems may occur, including: Damage to nearby structures or organs, such as the heart, blood vessels, or kidneys. A return of blockage. Bleeding, infection, or bruising at the insertion site. A collection of blood under the skin (hematoma) at the insertion site. A blood clot in another part of the body. Allergic reaction to medicines or dyes. Bleeding into the abdomen (retroperitoneal bleeding). Stroke (rare). Heart attack (rare). What happens before the procedure? Staying hydrated Follow instructions from your health care provider about hydration, which may include: Up to 2 hours before the procedure - you may continue to drink clear liquids, such as water, clear fruit juice, black coffee, and plain tea.    Eating and drinking restrictions Follow instructions from your health care provider about eating and drinking, which may include: 8 hours before the procedure - stop eating heavy meals or foods, such as meat, fried foods, or fatty foods. 6 hours before the procedure - stop eating light meals or foods, such as toast or cereal. 2 hours  before the procedure - stop drinking clear liquids. Medicines Ask your health care provider about: Changing or stopping your regular medicines. This is especially important if you are taking diabetes medicines or blood thinners. Taking medicines such as aspirin and ibuprofen. These medicines can thin your blood. Do not take these medicines unless your health care provider tells you to take them. Generally, aspirin is recommended before a thin tube, called a catheter, is passed through a blood vessel and inserted into the heart (cardiac catheterization). Taking over-the-counter medicines, vitamins, herbs, and supplements. General instructions Do not use any products that contain nicotine or tobacco for at least 4 weeks before the procedure. These products include cigarettes, e-cigarettes, and chewing tobacco. If you need help quitting, ask your health care provider. Plan to have someone take you home from the hospital or clinic. If you will be going home right after the procedure, plan to have someone with you for 24 hours. You may have tests and imaging procedures. Ask your health care provider: How your insertion site will be marked. Ask which artery will be used for the procedure. What steps  will be taken to help prevent infection. These may include: Removing hair at the insertion site. Washing skin with a germ-killing soap. Taking antibiotic medicine. What happens during the procedure? An IV will be inserted into one of your veins. Electrodes may be placed on your chest to monitor your heart rate during the procedure. You will be given one or more of the following: A medicine to help you relax (sedative). A medicine to numb the area (local anesthetic) for catheter insertion. A small incision will be made for catheter insertion. The catheter will be inserted into an artery using a guide wire. The location may be in your groin, your wrist, or the fold of your arm (near your elbow). An  X-ray procedure (fluoroscopy) will be used to help guide the catheter to the opening of the heart arteries. A dye will be injected into the catheter. X-rays will be taken. The dye helps to show where any narrowing or blockages are located in the arteries. Tell your health care provider if you have chest pain or trouble breathing. A tiny wire will be guided to the blocked spot, and a balloon will be inflated to make the artery wider. The stent will be expanded to crush the plaques into the wall of the vessel. The stent will hold the area open and improve the blood flow. Most stents have a drug coating to reduce the risk of the stent narrowing over time. The artery may be made wider using a drill, laser, or other tools that remove plaques. The catheter will be removed when the blood flow improves. The stent will stay where it was placed, and the lining of the artery will grow over it. A bandage (dressing) will be placed on the insertion site. Pressure will be applied to stop bleeding. The IV will be removed. This procedure may vary among health care providers and hospitals.    What happens after the procedure? Your blood pressure, heart rate, breathing rate, and blood oxygen level will be monitored until you leave the hospital or clinic. If the procedure is done through the leg, you will lie flat in bed for a few hours or for as long as told by your health care provider. You will be instructed not to bend or cross your legs. The insertion site and the pulse in your foot or wrist will be checked often. You may have more blood tests, X-rays, and a test that records the electrical activity of your heart (electrocardiogram, or ECG). Do not drive for 24 hours if you were given a sedative during your procedure. Summary Coronary angiogram with stent placement is a procedure to widen or open a narrowed coronary artery. This is done to treat heart problems. Before the procedure, let your health care provider  know about all the medical conditions and surgeries you have or have had. This is a safe procedure. However, some problems may occur, including damage to nearby structures or organs, bleeding, blood clots, or allergies. Follow your health care provider's instructions about eating, drinking, medicines, and other lifestyle changes, such as quitting tobacco use before the procedure. This information is not intended to replace advice given to you by your health care provider. Make sure you discuss any questions you have with your health care provider. Document Revised: 03/21/2019 Document Reviewed: 03/21/2019 Elsevier Patient Education  2021 [place].  Aspirin and Your Heart Aspirin is a medicine that prevents the platelets in your blood from sticking together. Platelets are the cells that your blood  uses for clotting. Aspirin can be used to help reduce the risk of blood clots, heart attacks, and other heart-related problems. What are the risks? Daily use of aspirin can cause side effects. Some of these include: Bleeding. Bleeding can be minor or serious. An example of minor bleeding is bleeding from a cut, and the bleeding does not stop. An example of more serious bleeding is stomach bleeding or, rarely, bleeding into the brain. Your risk of bleeding increases if you are also taking NSAIDs, such as ibuprofen. Increased bruising. Upset stomach. An allergic reaction. People who have growths inside the nose (nasal polyps) have an increased risk of developing an aspirin allergy. How to use aspirin to care for your heart Take aspirin only as told by your health care provider. Make sure that you understand how much to take and what form to take. The two forms of aspirin are: Non-enteric-coated.This type of aspirin does not have a coating and is absorbed quickly. This type of aspirin also comes in a chewable form. Enteric-coated. This type of aspirin has a coating that releases the medicine very slowly.  Enteric-coated aspirin might cause less stomach upset than non-enteric-coated aspirin. This type of aspirin should not be chewed or crushed. Work with your health care provider to find out whether it is safe and beneficial for you to take aspirin daily. Taking aspirin daily may be helpful if: You have had a heart attack or chest pain, or you are at risk for a heart attack. You have a condition in which certain heart vessels are blocked (coronary artery disease), and you have had a procedure to treat it. Examples are: Open-heart surgery, such as coronary artery bypass surgery (CABG). Coronary angioplasty,which is done to widen a blood vessel of your heart. Having a small mesh tube, or stent, placed in your coronary artery. You have had certain types of stroke or a mini-stroke known as a transient ischemic attack (TIA). You have a narrowing of the arteries that supply the limbs (peripheral artery disease, or PAD). You have long-term (chronic) heart rhythm problems, such as atrial fibrillation, and your health care provider thinks aspirin may help. You have valve disease or have had surgery on a valve. You are considered at increased risk of developing coronary artery disease or PAD.    Follow these instructions at home Medicines Take over-the-counter and prescription medicines only as told by your health care provider. If you are taking blood thinners: Talk with your health care provider before you take any medicines that contain aspirin or NSAIDs, such as ibuprofen. These medicines increase your risk for dangerous bleeding. Take your medicine exactly as told, at the same time every day. Avoid activities that could cause injury or bruising, and follow instructions about how to prevent falls. Wear a medical alert bracelet or carry a card that lists what medicines you take. General instructions Do not drink alcohol if: Your health care provider tells you not to drink. You are pregnant, may be  pregnant, or are planning to become pregnant. If you drink alcohol: Limit how much you use to: 0-1 drink a day for women. 0-2 drinks a day for men. Be aware of how much alcohol is in your drink. In the [place] one drink equals one 12 oz bottle of beer (355 mL), one 5 oz glass of wine (148 mL), or one 1 oz glass of hard liquor (44 mL). Keep all follow-up visits as told by your health care provider. This is important. Where to find  more information The American Heart Association: www.heart.org Contact a health care provider if you have: Unusual bleeding or bruising. Stomach pain or nausea. Ringing in your ears. An allergic reaction that causes hives, itchy skin, or swelling of the lips, tongue, or face. Get help right away if: You notice that your bowel movements are bloody, or dark red or black in color. You vomit or cough up blood. You have blood in your urine. You cough, breathe loudly (wheeze), or feel short of breath. You have chest pain, especially if the pain spreads to your arms, back, neck, or jaw. You have a headache with confusion. You have any symptoms of a stroke. "BE FAST" is an easy way to remember the main warning signs of a stroke: B - Balance. Signs are dizziness, sudden trouble walking, or loss of balance. E - Eyes. Signs are trouble seeing or a sudden change in vision. F - Face. Signs are sudden weakness or numbness of the face, or the face or eyelid drooping on one side. A - Arms. Signs are weakness or numbness in an arm. This happens suddenly and usually on one side of the body. S - Speech. Signs are sudden trouble speaking, slurred speech, or trouble understanding what people say. T - Time. Time to call emergency services. Write down what time symptoms started. You have other signs of a stroke, such as: A sudden, severe headache with no known cause. Nausea or vomiting. Seizure. These symptoms may represent a serious problem that is an emergency. Do not wait to see  if the symptoms will go away. Get medical help right away. Call your local emergency services (911 in the [place] Do not drive yourself to the hospital. Summary Aspirin use can help reduce the risk of blood clots, heart attacks, and other heart-related problems. Daily use of aspirin can cause side effects. Take aspirin only as told by your health care provider. Make sure that you understand how much to take and what form to take. Your health care provider will help you determine whether it is safe and beneficial for you to take aspirin daily. This information is not intended to replace advice given to you by your health care provider. Make sure you discuss any questions you have with your health care provider. Document Revised: 06/04/2019 Document Reviewed: 06/04/2019 Elsevier Patient Education  2021 [place]. Nitroglycerin sublingual tablets What is this medicine? NITROGLYCERIN (nye troe GLI ser in) is a type of vasodilator. It relaxes blood vessels, increasing the blood and oxygen supply to your heart. This medicine is used to relieve chest pain caused by angina. It is also used to prevent chest pain before activities like climbing stairs, going outdoors in cold weather, or sexual activity. This medicine may be used for other purposes; ask your health care provider or pharmacist if you have questions. COMMON BRAND NAME(S): Nitroquick, Nitrostat, Nitrotab What should I tell my health care provider before I take this medicine? They need to know if you have any of these conditions: anemia head injury, recent stroke, or bleeding in the brain liver disease previous heart attack an unusual or allergic reaction to nitroglycerin, other medicines, foods, dyes, or preservatives pregnant or trying to get pregnant breast-feeding How should I use this medicine? Take this medicine by mouth as needed. Use at the first sign of an angina attack (chest pain or tightness). You can also take this medicine  5 to 10 minutes before an event likely to produce chest pain. Follow the directions exactly  as written on the prescription label. Place one tablet under your tongue and let it dissolve. Do not swallow whole. Replace the dose if you accidentally swallow it. It will help if your mouth is not dry. Saliva around the tablet will help it to dissolve more quickly. Do not eat or drink, smoke or chew tobacco while a tablet is dissolving. Sit down when taking this medicine. In an angina attack, you should feel better within 5 minutes after your first dose. You can take a dose every 5 minutes up to a total of 3 doses. If you do not feel better or feel worse after 1 dose, call 9-1-1 at once. Do not take more than 3 doses in 15 minutes. Your health care provider might give you other directions. Follow those directions if he or she does. Do not take your medicine more often than directed. Talk to your health care provider about the use of this medicine in children. Special care may be needed. Overdosage: If you think you have taken too much of this medicine contact a poison control center or emergency room at once. NOTE: This medicine is only for you. Do not share this medicine with others. What if I miss a dose? This does not apply. This medicine is only used as needed. What may interact with this medicine? Do not take this medicine with any of the following medications: certain migraine medicines like ergotamine and dihydroergotamine (DHE) medicines used to treat erectile dysfunction like sildenafil, tadalafil, and vardenafil riociguat This medicine may also interact with the following medications: alteplase aspirin heparin medicines for high blood pressure medicines for mental depression other medicines used to treat angina phenothiazines like chlorpromazine, mesoridazine, prochlorperazine, thioridazine This list may not describe all possible interactions. Give your health care provider a list of all the  medicines, herbs, non-prescription drugs, or dietary supplements you use. Also tell them if you smoke, drink alcohol, or use illegal drugs. Some items may interact with your medicine. What should I watch for while using this medicine? Tell your doctor or health care professional if you feel your medicine is no longer working. Keep this medicine with you at all times. Sit or lie down when you take your medicine to prevent falling if you feel dizzy or faint after using it. Try to remain calm. This will help you to feel better faster. If you feel dizzy, take several deep breaths and lie down with your feet propped up, or bend forward with your head resting between your knees. You may get drowsy or dizzy. Do not drive, use machinery, or do anything that needs mental alertness until you know how this drug affects you. Do not stand or sit up quickly, especially if you are an older patient. This reduces the risk of dizzy or fainting spells. Alcohol can make you more drowsy and dizzy. Avoid alcoholic drinks. Do not treat yourself for coughs, colds, or pain while you are taking this medicine without asking your doctor or health care professional for advice. Some ingredients may increase your blood pressure. What side effects may I notice from receiving this medicine? Side effects that you should report to your doctor or health care professional as soon as possible: allergic reactions (skin rash, itching or hives; swelling of the face, lips, or tongue) low blood pressure (dizziness; feeling faint or lightheaded, falls; unusually weak or tired) low red blood cell counts (trouble breathing; feeling faint; lightheaded, falls; unusually weak or tired) Side effects that usually do not require medical  attention (report to your doctor or health care professional if they continue or are bothersome): facial flushing (redness) headache nausea, vomiting This list may not describe all possible side effects. Call your  doctor for medical advice about side effects. You may report side effects to FDA at 1-800-FDA-1088. Where should I keep my medicine? Keep out of the reach of children. Store at room temperature between 20 and 25 degrees C (68 and 77 degrees F). Store in [employer]. Protect from light and moisture. Keep tightly closed. Throw away any unused medicine after the expiration date. NOTE: This sheet is a summary. It may not cover all possible information. If you have questions about this medicine, talk to your doctor, pharmacist, or health care provider.  2021 Elsevier/Gold Standard (2018-05-31 16:46:32)

## 2022-08-09 NOTE — H&P (View-Only) (Signed)
Cardiology Office Note:    Date:  08/09/2022   ID:  Taylor Paul, DOB 08/08/1956, MRN [id number]  PCP:  [facility]  Cardiologist:  Norris Brumbach R Alilah Mcmeans, MD   Referring MD: [facility]    ASSESSMENT:    1. Coronary artery disease involving native coronary artery of native heart without angina pectoris   2. Essential hypertension   3. NSVT (nonsustained ventricular tachycardia) (HCC)   4. Mixed hyperlipidemia   5. Angina pectoris (HCC)    PLAN:    In order of problems listed above:  Coronary artery disease and angina pectoris: Secondary prevention stressed with the patient.  Importance of compliance with diet medication stressed and she vocalized understanding.  I advised her to take a coated baby aspirin on a daily basis.I discussed coronary angiography and left heart catheterization with the patient at extensive length. Procedure, benefits and potential risks were explained. Patient had multiple questions which were answered to the patient's satisfaction. Patient agreed and consented for the procedure. Further recommendations will be made based on the findings of the coronary angiography. In the interim. The patient has any significant symptoms he knows to go to the nearest emergency room. Cigarette smoker: I spent 5 minutes with the patient discussing solely about smoking. Smoking cessation was counseled. I suggested to the patient also different medications and pharmacological interventions. Patient is keen to try stopping on its own at this time. He will get back to me if he needs any further assistance in this matter. Mixed dyslipidemia: On lipid-lowering medications.  Lipids reviewed diet emphasized. Essential hypertension: Blood pressure is stable and diet was emphasized.  Lifestyle modification urged.  She will be seen in follow-up appointment after the coronary angiography.   Medication Adjustments/Labs and Tests Ordered: Current medicines are reviewed at  length with the patient today.  Concerns regarding medicines are outlined above.  No orders of the defined types were placed in this encounter.  No orders of the defined types were placed in this encounter.    No chief complaint on file.    History of Present Illness:    Taylor Paul is a 65 y.o. female.  Patient has past medical history of coronary artery disease by CT coronary angiography in the past, essential hypertension, mixed dyslipidemia, COPD and unfortunately she continues to smoke.  She had a stress test at the hospital which was abnormal.  More importantly her story is of chest tightness on exertion when she walks her dog or steps up to put her garbage out.  No orthopnea or PND.  This chest tightness limits her effort and relief with nitroglycerin.  At the time of my evaluation, the patient is alert awake oriented and in no distress.  Past Medical History:  Diagnosis Date   Acute gastric ulcer with perforation (HCC) 06/05/2019   Chest discomfort 12/23/2020   Congestive heart disease (HCC)    COPD (chronic obstructive pulmonary disease) (HCC)    Coronary artery disease 04/27/2022   Depressed left ventricular ejection fraction 02/18/2020   Encounter for screening colonoscopy 12/25/2019   Essential hypertension 01/18/2020   Hypertension    Mixed hyperlipidemia 01/18/2020   Nonischemic cardiomyopathy (HCC) 10/13/2021   NSVT (nonsustained ventricular tachycardia) (HCC) 03/19/2020   Other chest pain 01/18/2020   PAC (premature atrial contraction) 03/19/2020   Paroxysmal SVT (supraventricular tachycardia) 03/19/2020   Postoperative examination 06/05/2019   Preoperative clearance 01/18/2020   PVC (premature ventricular contraction) 01/18/2020   Shortness of breath 12/23/2020     Sinus pause 03/19/2020   Smoker 01/18/2020    Past Surgical History:  Procedure Laterality Date   APPENDECTOMY     STOMACH SURGERY     Perforated gastric ulcer    Current  Medications: Current Meds  Medication Sig   aspirin EC 81 MG tablet Take 81 mg by mouth daily.   atorvastatin (LIPITOR) 20 MG tablet Take 20 mg by mouth daily.   busPIRone (BUSPAR) 5 MG tablet Take 5 mg by mouth daily.   carvedilol (COREG) 6.25 MG tablet TAKE 1 TABLET BY MOUTH 2 TIMES DAILY   Dextromethorphan HBr (DELSYM PO) Take 5 mg by mouth as needed (cough).   fluticasone (FLONASE) 50 MCG/ACT nasal spray Place 1 spray into both nostrils as needed for allergies or rhinitis.   Fluticasone-Umeclidin-Vilant (TRELEGY ELLIPTA IN) Inhale 1 puff into the lungs daily.   furosemide (LASIX) 40 MG tablet Take 40 mg by mouth daily.   guaiFENesin (MUCINEX PO) Take 1 tablet by mouth 2 (two) times daily.   lisinopril (ZESTRIL) 20 MG tablet Take 20 mg by mouth daily.   montelukast (SINGULAIR) 10 MG tablet Take 1 tablet by mouth daily.   nitroGLYCERIN (NITROSTAT) 0.4 MG SL tablet DISSOLVE 1 TABLET UNDER TONGUE EVERY 5 MINUTES AS NEEDED FOR CHEST PAIN. IF NO RELIEF AFTER 3 DOSES CALL 911.   pantoprazole (PROTONIX) 40 MG tablet Take 1 tablet by mouth 2 (two) times daily.     Allergies:   Amoxicillin and Tramadol   Social History   Socioeconomic History   Marital status: Single    Spouse name: Not on file   Number of children: Not on file   Years of education: Not on file   Highest education level: Not on file  Occupational History   Not on file  Tobacco Use   Smoking status: Every Day   Smokeless tobacco: Never  Substance and Sexual Activity   Alcohol use: Never   Drug use: Never   Sexual activity: Not on file  Other Topics Concern   Not on file  Social History Narrative   Not on file   Social Determinants of Health   Financial Resource Strain: Not on file  Food Insecurity: Not on file  Transportation Needs: Not on file  Physical Activity: Not on file  Stress: Not on file  Social Connections: Not on file     Family History: The patient's family history includes Congestive Heart  Failure in her father; Diabetes in her brother, father, and mother; Heart disease in her brother; Hypertension in her brother, father, and mother; Stroke in her brother.  ROS:   Please see the history of present illness.    All other systems reviewed and are negative.  EKGs/Labs/Other Studies Reviewed:    The following studies were reviewed today: EKG reveals sinus rhythm anteroseptal infarction and biatrial enlargement and ST depressions in the inferior leads.   Recent Labs: No results found for requested labs within last 365 days.  Recent Lipid Panel No results found for: "CHOL", "TRIG", "HDL", "CHOLHDL", "VLDL", "LDLCALC", "LDLDIRECT"  Physical Exam:    VS:  BP 118/62   Pulse 97   Ht 5' 3" (1.6 m)   Wt 97 lb 3.2 oz (44.1 kg)   SpO2 96%   BMI 17.22 kg/m     Wt Readings from Last 3 Encounters:  08/09/22 97 lb 3.2 oz (44.1 kg)  04/27/22 113 lb (51.3 kg)  10/13/21 118 lb 12.8 oz (53.9 kg)     GEN: Patient   is in no acute distress HEENT: Normal NECK: No JVD; No carotid bruits LYMPHATICS: No lymphadenopathy CARDIAC: Hear sounds regular, 2/6 systolic murmur at the apex. RESPIRATORY:  Clear to auscultation without rales, wheezing or rhonchi  ABDOMEN: Soft, non-tender, non-distended MUSCULOSKELETAL:  No edema; No deformity  SKIN: Warm and dry NEUROLOGIC:  Alert and oriented x 3 PSYCHIATRIC:  Normal affect   Signed, Garwin Brothers, MD  08/09/2022 8:54 AM    [facility]

## 2022-08-10 LAB — BASIC METABOLIC PANEL
BUN/Creatinine Ratio: 17 (ref 12–28)
BUN: 33 mg/dL — ABNORMAL HIGH (ref 8–27)
CO2: 11 mmol/L — ABNORMAL LOW (ref 20–29)
Calcium: 9.7 mg/dL (ref 8.7–10.3)
Chloride: 110 mmol/L — ABNORMAL HIGH (ref 96–106)
Creatinine, Ser: 1.93 mg/dL — ABNORMAL HIGH (ref 0.57–1.00)
Glucose: 125 mg/dL — ABNORMAL HIGH (ref 70–99)
Potassium: 4.7 mmol/L (ref 3.5–5.2)
Sodium: 139 mmol/L (ref 134–144)
eGFR: 29 mL/min/{1.73_m2} — ABNORMAL LOW (ref >59)

## 2022-08-10 LAB — CBC
Hematocrit: 46.6 % (ref 34.0–46.6)
Hemoglobin: 15.8 g/dL (ref 11.1–15.9)
MCH: 30.6 pg (ref 26.6–33.0)
MCHC: 33.9 g/dL (ref 31.5–35.7)
MCV: 90 fL (ref 79–97)
Platelets: 349 10*3/uL (ref 150–450)
RBC: 5.17 x10E6/uL (ref 3.77–5.28)
RDW: 14.8 % (ref 11.7–15.4)
WBC: 10 10*3/uL (ref 3.4–10.8)

## 2022-08-10 NOTE — H&P (Signed)
Known coronary disease with prior coronary CTA demonstrating moderate the disease involving the LAD moderately and circumflex and RCA mildly. Progressive angina pectoris impairing lifestyle Risk factors include smoking, primary hypertension, mixed hyperlipidemia,

## 2022-08-11 ENCOUNTER — Telehealth: Payer: Self-pay | Admitting: *Deleted

## 2022-08-11 NOTE — Telephone Encounter (Signed)
08/09/22 GFR 29-per cath lab protocol GFR <45:  -pre-procedure hydration arranged -pt instructed to hold lisinopril/lasix/KCl day before and day of procedure -avoid NSAIDs  I will forward to Dr Tomie China for his review and recommendations, if any.

## 2022-08-11 NOTE — Telephone Encounter (Signed)
Transportation to and from hospital 08/13/22 will be provided by [facility], [phone].  I have spoken with Romeo Apple at [facility] and confirmed transportation will be provided for patient to arrive at [facility] 08/13/22 at 5:30 AM for pre-procedure hydration prior to procedure at 10:30 AM. Patient is aware of plan.

## 2022-08-11 NOTE — Telephone Encounter (Signed)
This note has been sent to Dr Katrinka Blazing.

## 2022-08-11 NOTE — Telephone Encounter (Signed)
Cardiac Catheterization scheduled at [facility] for: Friday August 13, 2022 10:30 AM Arrival time and place: [facility] Main Entrance A at:  5:30 AM-pre-procedure hydration  Nothing to eat after midnight prior to procedure, clear liquids until 5 AM day of procedure  Medication instructions: -Hold:  Lasix/KCl/Lisinopril-day before and day of procedure -per protocol GFR 29 -Except hold medications usual morning medications can be taken with sips of water including aspirin 81 mg.  Confirmed patient has responsible adult to drive home post procedure and be with patient first 24 hours after arriving home.  Patient reports no new symptoms concerning for COVID-19 in the past 10 days.  Reviewed procedure instructions/ pre-procedure hydration with patient.

## 2022-08-13 ENCOUNTER — Other Ambulatory Visit: Payer: Self-pay

## 2022-08-13 ENCOUNTER — Inpatient Hospital Stay (HOSPITAL_COMMUNITY)
Admission: RE | Admit: 2022-08-13 | Discharge: 2022-08-18 | DRG: 322 | Disposition: A | Discharge status: Home / Self Care | Payer: Medicare (Managed Care) | Attending: Cardiovascular Disease | Admitting: Cardiovascular Disease

## 2022-08-13 ENCOUNTER — Inpatient Hospital Stay (HOSPITAL_COMMUNITY)
Admission: AD | Disposition: A | Discharge status: Home / Self Care | Payer: Self-pay | Source: Home / Self Care | Attending: Interventional Cardiology

## 2022-08-13 DIAGNOSIS — F17210 Nicotine dependence, cigarettes, uncomplicated: Secondary | ICD-10-CM | POA: Diagnosis present

## 2022-08-13 DIAGNOSIS — I130 Hypertensive heart and chronic kidney disease with heart failure and stage 1 through stage 4 chronic kidney disease, or unspecified chronic kidney disease: Secondary | ICD-10-CM | POA: Diagnosis present

## 2022-08-13 DIAGNOSIS — Z955 Presence of coronary angioplasty implant and graft: Principal | ICD-10-CM

## 2022-08-13 DIAGNOSIS — Z720 Tobacco use: Secondary | ICD-10-CM | POA: Diagnosis not present

## 2022-08-13 DIAGNOSIS — R931 Abnormal findings on diagnostic imaging of heart and coronary circulation: Secondary | ICD-10-CM | POA: Diagnosis present

## 2022-08-13 DIAGNOSIS — E782 Mixed hyperlipidemia: Secondary | ICD-10-CM | POA: Diagnosis present

## 2022-08-13 DIAGNOSIS — I428 Other cardiomyopathies: Secondary | ICD-10-CM | POA: Diagnosis present

## 2022-08-13 DIAGNOSIS — Z823 Family history of stroke: Secondary | ICD-10-CM | POA: Diagnosis not present

## 2022-08-13 DIAGNOSIS — N1831 Chronic kidney disease, stage 3a: Secondary | ICD-10-CM | POA: Diagnosis present

## 2022-08-13 DIAGNOSIS — J449 Chronic obstructive pulmonary disease, unspecified: Secondary | ICD-10-CM | POA: Diagnosis present

## 2022-08-13 DIAGNOSIS — Z538 Procedure and treatment not carried out for other reasons: Secondary | ICD-10-CM | POA: Diagnosis not present

## 2022-08-13 DIAGNOSIS — I472 Ventricular tachycardia, unspecified: Secondary | ICD-10-CM | POA: Diagnosis present

## 2022-08-13 DIAGNOSIS — N1411 Contrast-induced nephropathy: Secondary | ICD-10-CM | POA: Diagnosis present

## 2022-08-13 DIAGNOSIS — I25119 Atherosclerotic heart disease of native coronary artery with unspecified angina pectoris: Secondary | ICD-10-CM | POA: Diagnosis not present

## 2022-08-13 DIAGNOSIS — I25110 Atherosclerotic heart disease of native coronary artery with unstable angina pectoris: Secondary | ICD-10-CM | POA: Diagnosis not present

## 2022-08-13 DIAGNOSIS — I501 Left ventricular failure: Secondary | ICD-10-CM | POA: Diagnosis present

## 2022-08-13 DIAGNOSIS — I10 Essential (primary) hypertension: Secondary | ICD-10-CM

## 2022-08-13 DIAGNOSIS — E785 Hyperlipidemia, unspecified: Secondary | ICD-10-CM | POA: Diagnosis not present

## 2022-08-13 DIAGNOSIS — Z79899 Other long term (current) drug therapy: Secondary | ICD-10-CM | POA: Diagnosis not present

## 2022-08-13 DIAGNOSIS — I2510 Atherosclerotic heart disease of native coronary artery without angina pectoris: Secondary | ICD-10-CM | POA: Diagnosis not present

## 2022-08-13 DIAGNOSIS — Z8249 Family history of ischemic heart disease and other diseases of the circulatory system: Secondary | ICD-10-CM | POA: Diagnosis not present

## 2022-08-13 DIAGNOSIS — F17200 Nicotine dependence, unspecified, uncomplicated: Secondary | ICD-10-CM | POA: Diagnosis present

## 2022-08-13 DIAGNOSIS — I209 Angina pectoris, unspecified: Secondary | ICD-10-CM | POA: Diagnosis not present

## 2022-08-13 DIAGNOSIS — T508X5A Adverse effect of diagnostic agents, initial encounter: Secondary | ICD-10-CM | POA: Diagnosis present

## 2022-08-13 DIAGNOSIS — I4729 Other ventricular tachycardia: Secondary | ICD-10-CM | POA: Diagnosis present

## 2022-08-13 DIAGNOSIS — I200 Unstable angina: Principal | ICD-10-CM | POA: Diagnosis present

## 2022-08-13 DIAGNOSIS — R0989 Other specified symptoms and signs involving the circulatory and respiratory systems: Secondary | ICD-10-CM | POA: Diagnosis present

## 2022-08-13 HISTORY — PX: CORONARY STENT INTERVENTION: CATH118234

## 2022-08-13 HISTORY — PX: LEFT HEART CATH AND CORONARY ANGIOGRAPHY: CATH118249

## 2022-08-13 HISTORY — DX: Atherosclerotic heart disease of native coronary artery without angina pectoris: I25.10

## 2022-08-13 LAB — POCT ACTIVATED CLOTTING TIME
Activated Clotting Time: 234 seconds
Activated Clotting Time: 271 seconds
Activated Clotting Time: 363 seconds

## 2022-08-13 SURGERY — LEFT HEART CATH AND CORONARY ANGIOGRAPHY
Anesthesia: LOCAL

## 2022-08-13 MED ORDER — SODIUM CHLORIDE 0.9% FLUSH: 3.0000 mL | Freq: Two times a day (BID) | INTRAVENOUS | Status: DC

## 2022-08-13 MED ORDER — CARVEDILOL 6.25 MG PO TABS
6.2500 mg | ORAL_TABLET | Freq: Two times a day (BID) | ORAL | Status: DC
  Administered 2022-08-13 – 2022-08-15 (×4): 6.25 mg via ORAL
  Filled 2022-08-13 (×4): qty 1

## 2022-08-13 MED ORDER — FENTANYL CITRATE (PF) 100 MCG/2ML IJ SOLN
INTRAMUSCULAR | Status: DC | PRN
  Administered 2022-08-13 (×2): 25 ug via INTRAVENOUS

## 2022-08-13 MED ORDER — HEPARIN SODIUM (PORCINE) 1000 UNIT/ML IJ SOLN
INTRAMUSCULAR | Status: DC | PRN
  Administered 2022-08-13: 2500 [IU] via INTRAVENOUS
  Administered 2022-08-13: 2000 [IU] via INTRAVENOUS
  Administered 2022-08-13: 3000 [IU] via INTRAVENOUS
  Administered 2022-08-13: 2000 [IU] via INTRAVENOUS

## 2022-08-13 MED ORDER — PANTOPRAZOLE SODIUM 40 MG PO TBEC
40.0000 mg | DELAYED_RELEASE_TABLET | Freq: Every day | ORAL | Status: DC
  Administered 2022-08-13 – 2022-08-18 (×6): 40 mg via ORAL
  Filled 2022-08-13 (×6): qty 1

## 2022-08-13 MED ORDER — SODIUM CHLORIDE 0.9 % IV SOLN: 250.0000 mL | INTRAVENOUS | Status: DC | PRN

## 2022-08-13 MED ORDER — VERAPAMIL HCL 2.5 MG/ML IV SOLN
INTRAVENOUS | Status: DC | PRN
  Administered 2022-08-13: 10 mL via INTRA_ARTERIAL

## 2022-08-13 MED ORDER — HEPARIN (PORCINE) IN NACL 1000-0.9 UT/500ML-% IV SOLN
INTRAVENOUS | Status: DC | PRN
  Administered 2022-08-13 (×4): 500 mL

## 2022-08-13 MED ORDER — HEPARIN (PORCINE) IN NACL 1000-0.9 UT/500ML-% IV SOLN
INTRAVENOUS | Status: AC
  Filled 2022-08-13: qty 1000

## 2022-08-13 MED ORDER — CLOPIDOGREL BISULFATE 75 MG PO TABS
75.0000 mg | ORAL_TABLET | Freq: Every day | ORAL | Status: DC
  Administered 2022-08-14 – 2022-08-18 (×5): 75 mg via ORAL
  Filled 2022-08-13 (×5): qty 1

## 2022-08-13 MED ORDER — FLUTICASONE-UMECLIDIN-VILANT 100-62.5-25 MCG/ACT IN AEPB: INHALATION_SPRAY | Freq: Every day | RESPIRATORY_TRACT | Status: DC

## 2022-08-13 MED ORDER — LORATADINE 10 MG PO TABS
10.0000 mg | ORAL_TABLET | Freq: Every day | ORAL | Status: DC
  Administered 2022-08-13 – 2022-08-18 (×6): 10 mg via ORAL
  Filled 2022-08-13 (×6): qty 1

## 2022-08-13 MED ORDER — ASPIRIN 81 MG PO TBEC: 81.0000 mg | DELAYED_RELEASE_TABLET | Freq: Every day | ORAL | Status: DC

## 2022-08-13 MED ORDER — ASPIRIN 81 MG PO CHEW: 81.0000 mg | CHEWABLE_TABLET | ORAL | Status: DC

## 2022-08-13 MED ORDER — MIDAZOLAM HCL 2 MG/2ML IJ SOLN
INTRAMUSCULAR | Status: DC | PRN
  Administered 2022-08-13 (×2): .5 mg via INTRAVENOUS

## 2022-08-13 MED ORDER — VERAPAMIL HCL 2.5 MG/ML IV SOLN
INTRAVENOUS | Status: AC
  Filled 2022-08-13: qty 2

## 2022-08-13 MED ORDER — NITROGLYCERIN 1 MG/10 ML FOR IR/CATH LAB
INTRA_ARTERIAL | Status: AC
  Filled 2022-08-13: qty 10

## 2022-08-13 MED ORDER — MIDAZOLAM HCL 2 MG/2ML IJ SOLN
INTRAMUSCULAR | Status: AC
  Filled 2022-08-13: qty 2

## 2022-08-13 MED ORDER — ASPIRIN 81 MG PO CHEW: 81.0000 mg | CHEWABLE_TABLET | Freq: Every day | ORAL | Status: DC

## 2022-08-13 MED ORDER — OXYCODONE HCL 5 MG PO TABS: 5.0000 mg | ORAL_TABLET | ORAL | Status: DC | PRN

## 2022-08-13 MED ORDER — SERTRALINE HCL 50 MG PO TABS
50.0000 mg | ORAL_TABLET | Freq: Every day | ORAL | Status: DC
  Administered 2022-08-13 – 2022-08-18 (×6): 50 mg via ORAL
  Filled 2022-08-13 (×6): qty 1

## 2022-08-13 MED ORDER — SODIUM CHLORIDE 0.9 % WEIGHT BASED INFUSION
3.0000 mL/kg/h | INTRAVENOUS | Status: DC
  Administered 2022-08-13: 3 mL/kg/h via INTRAVENOUS

## 2022-08-13 MED ORDER — SODIUM CHLORIDE 0.9 % IV SOLN: INTRAVENOUS | Status: AC

## 2022-08-13 MED ORDER — HEPARIN SODIUM (PORCINE) 1000 UNIT/ML IJ SOLN
INTRAMUSCULAR | Status: AC
  Filled 2022-08-13: qty 10

## 2022-08-13 MED ORDER — FLUTICASONE PROPIONATE 50 MCG/ACT NA SUSP: 1.0000 | Freq: Two times a day (BID) | NASAL | Status: DC | PRN

## 2022-08-13 MED ORDER — SODIUM CHLORIDE 0.9 % IV SOLN
INTRAVENOUS | Status: AC | PRN
  Administered 2022-08-13: 75 mL/h via INTRAVENOUS

## 2022-08-13 MED ORDER — IOHEXOL 350 MG/ML SOLN
INTRAVENOUS | Status: DC | PRN
  Administered 2022-08-13: 170 mL

## 2022-08-13 MED ORDER — CLOPIDOGREL BISULFATE 300 MG PO TABS
ORAL_TABLET | ORAL | Status: AC
  Filled 2022-08-13: qty 2

## 2022-08-13 MED ORDER — FLUTICASONE FUROATE-VILANTEROL 100-25 MCG/ACT IN AEPB
1.0000 | INHALATION_SPRAY | Freq: Every day | RESPIRATORY_TRACT | Status: DC
  Filled 2022-08-13: qty 28

## 2022-08-13 MED ORDER — METOCLOPRAMIDE HCL 5 MG PO TABS
5.0000 mg | ORAL_TABLET | Freq: Three times a day (TID) | ORAL | Status: DC
  Administered 2022-08-13 – 2022-08-18 (×11): 5 mg via ORAL
  Filled 2022-08-13 (×12): qty 1

## 2022-08-13 MED ORDER — LABETALOL HCL 5 MG/ML IV SOLN: 10.0000 mg | INTRAVENOUS | Status: AC | PRN

## 2022-08-13 MED ORDER — ASPIRIN 325 MG PO TBEC
325.0000 mg | DELAYED_RELEASE_TABLET | Freq: Every day | ORAL | Status: DC
  Administered 2022-08-14: 325 mg via ORAL
  Filled 2022-08-13: qty 1

## 2022-08-13 MED ORDER — ONDANSETRON HCL 4 MG/2ML IJ SOLN
4.0000 mg | Freq: Four times a day (QID) | INTRAMUSCULAR | Status: DC | PRN
  Filled 2022-08-13: qty 2

## 2022-08-13 MED ORDER — UMECLIDINIUM BROMIDE 62.5 MCG/ACT IN AEPB
1.0000 | INHALATION_SPRAY | Freq: Every day | RESPIRATORY_TRACT | Status: DC
  Administered 2022-08-14 – 2022-08-18 (×5): 1 via RESPIRATORY_TRACT

## 2022-08-13 MED ORDER — LIDOCAINE HCL (PF) 1 % IJ SOLN
INTRAMUSCULAR | Status: AC
  Filled 2022-08-13: qty 30

## 2022-08-13 MED ORDER — FLUTICASONE FUROATE-VILANTEROL 100-25 MCG/ACT IN AEPB
1.0000 | INHALATION_SPRAY | Freq: Every day | RESPIRATORY_TRACT | Status: DC
  Administered 2022-08-14 – 2022-08-18 (×5): 1 via RESPIRATORY_TRACT

## 2022-08-13 MED ORDER — FENTANYL CITRATE (PF) 100 MCG/2ML IJ SOLN
INTRAMUSCULAR | Status: AC
  Filled 2022-08-13: qty 2

## 2022-08-13 MED ORDER — SODIUM CHLORIDE 0.9% FLUSH: 3.0000 mL | INTRAVENOUS | Status: DC | PRN

## 2022-08-13 MED ORDER — SODIUM CHLORIDE 0.9% FLUSH
3.0000 mL | Freq: Two times a day (BID) | INTRAVENOUS | Status: DC
  Administered 2022-08-14 – 2022-08-17 (×7): 3 mL via INTRAVENOUS

## 2022-08-13 MED ORDER — LIDOCAINE HCL (PF) 1 % IJ SOLN
INTRAMUSCULAR | Status: DC | PRN
  Administered 2022-08-13: 2 mL

## 2022-08-13 MED ORDER — CLOPIDOGREL BISULFATE 300 MG PO TABS
ORAL_TABLET | ORAL | Status: DC | PRN
  Administered 2022-08-13: 600 mg via ORAL

## 2022-08-13 MED ORDER — NITROGLYCERIN 0.4 MG SL SUBL: 0.4000 mg | SUBLINGUAL_TABLET | SUBLINGUAL | Status: DC | PRN

## 2022-08-13 MED ORDER — HYDRALAZINE HCL 20 MG/ML IJ SOLN: 10.0000 mg | INTRAMUSCULAR | Status: AC | PRN

## 2022-08-13 MED ORDER — SODIUM CHLORIDE 0.9 % WEIGHT BASED INFUSION: 1.0000 mL/kg/h | INTRAVENOUS | Status: DC

## 2022-08-13 MED ORDER — UMECLIDINIUM BROMIDE 62.5 MCG/ACT IN AEPB
1.0000 | INHALATION_SPRAY | Freq: Every day | RESPIRATORY_TRACT | Status: DC
  Filled 2022-08-13: qty 7

## 2022-08-13 MED ORDER — ATORVASTATIN CALCIUM 10 MG PO TABS
20.0000 mg | ORAL_TABLET | Freq: Every day | ORAL | Status: DC
  Administered 2022-08-13 – 2022-08-15 (×3): 20 mg via ORAL
  Filled 2022-08-13 (×3): qty 2

## 2022-08-13 MED ORDER — ACETAMINOPHEN 325 MG PO TABS
650.0000 mg | ORAL_TABLET | ORAL | Status: DC | PRN
  Administered 2022-08-13 – 2022-08-16 (×2): 650 mg via ORAL
  Filled 2022-08-13 (×2): qty 2

## 2022-08-13 SURGICAL SUPPLY — 20 items
CATH 5FR JL3.5 JR4 ANG PIG MP (CATHETERS) IMPLANT
CATH LAUNCHER 6FR AL3 (CATHETERS) IMPLANT
CATH LAUNCHER 6FR EBU3.5 (CATHETERS) IMPLANT
CATH VISTA GUIDE 6FR AL2 (CATHETERS) IMPLANT
CATH VISTA GUIDE 6FR XB4 (CATHETERS) IMPLANT
CATH VISTA GUIDE 6FR XBLAD3.0 (CATHETERS) IMPLANT
CATH VISTA GUIDE 6FR XBLAD3.5 (CATHETERS) IMPLANT
DEVICE RAD COMP TR BAND LRG (VASCULAR PRODUCTS) IMPLANT
DEVICE TORQUE .025-.038 (MISCELLANEOUS) IMPLANT
ELECT DEFIB PAD ADLT CADENCE (PAD) IMPLANT
GLIDESHEATH SLEND A-KIT 6F 22G (SHEATH) IMPLANT
GUIDEWIRE INQWIRE 1.5J.035X260 (WIRE) IMPLANT
INQWIRE 1.5J .035X260CM (WIRE) ×1
KIT ENCORE 26 ADVANTAGE (KITS) IMPLANT
KIT HEART LEFT (KITS) ×1 IMPLANT
PACK CARDIAC CATHETERIZATION (CUSTOM PROCEDURE TRAY) ×1 IMPLANT
SHEATH PROBE COVER 6X72 (BAG) IMPLANT
TRANSDUCER W/STOPCOCK (MISCELLANEOUS) ×1 IMPLANT
TUBING CIL FLEX 10 FLL-RA (TUBING) ×1 IMPLANT
WIRE ASAHI PROWATER 180CM (WIRE) IMPLANT

## 2022-08-13 NOTE — Interval H&P Note (Signed)
History and Physical Interval Note:  08/13/2022 10:50 AM  Taylor Paul  has presented today for surgery, with the diagnosis of abnormal stress test.  The various methods of treatment have been discussed with the patient and family. After consideration of risks, benefits and other options for treatment, the patient has consented to  Procedure(s): LEFT HEART CATH AND CORONARY ANGIOGRAPHY (N/A) as a surgical intervention.  The patient's history has been reviewed, patient examined, no change in status, stable for surgery.  I have reviewed the patient's chart and labs.  Questions were answered to the patient's satisfaction.     Lyn Records III

## 2022-08-13 NOTE — Op Note (Signed)
Tight mid LAD with right to left collaterals.  Moderate right coronary which supplies collaterals to mid to distal LAD. Coronaries otherwise widely patent. Attempted angioplasty failed due to posterior takeoff with superior course.  Had difficulty finding guide catheters that would remain in the vessel when went trying to wire the vessel.  Wires preferentially tracked into the circumflex. Unfortunately the patient has CKD and we significantly exceeded contrast parameters using 170 cc of contrast, exceeding the contrast limited by 80 cc. Plan IV hydration, follow-up of kidney function, and when stable plan reattempt of LAD angioplasty from femoral approach perhaps using a 90 degree venture catheter to be able to direct the guidewire into the LAD.

## 2022-08-13 NOTE — Interval H&P Note (Signed)
Cath Lab Visit (complete for each Cath Lab visit)  Clinical Evaluation Leading to the Procedure:   ACS: No.  Non-ACS:    Anginal Classification: CCS III  Anti-ischemic medical therapy: Maximal Therapy (2 or more classes of medications)  Non-Invasive Test Results: No non-invasive testing performed  Prior CABG: No previous CABG      History and Physical Interval Note:  08/13/2022 10:50 AM  Taylor Paul  has presented today for surgery, with the diagnosis of abnormal stress test.  The various methods of treatment have been discussed with the patient and family. After consideration of risks, benefits and other options for treatment, the patient has consented to  Procedure(s): LEFT HEART CATH AND CORONARY ANGIOGRAPHY (N/A) as a surgical intervention.  The patient's history has been reviewed, patient examined, no change in status, stable for surgery.  I have reviewed the patient's chart and labs.  Questions were answered to the patient's satisfaction.     Lyn Records III

## 2022-08-14 DIAGNOSIS — I209 Angina pectoris, unspecified: Secondary | ICD-10-CM

## 2022-08-14 LAB — BASIC METABOLIC PANEL
Anion gap: 5 (ref 5–15)
Anion gap: 7 (ref 5–15)
BUN: 21 mg/dL (ref 8–23)
BUN: 17 mg/dL (ref 8–23)
CO2: 17 mmol/L — ABNORMAL LOW (ref 22–32)
CO2: 16 mmol/L — ABNORMAL LOW (ref 22–32)
Calcium: 8.7 mg/dL — ABNORMAL LOW (ref 8.9–10.3)
Calcium: 8.8 mg/dL — ABNORMAL LOW (ref 8.9–10.3)
Chloride: 116 mmol/L — ABNORMAL HIGH (ref 98–111)
Chloride: 115 mmol/L — ABNORMAL HIGH (ref 98–111)
Creatinine, Ser: 1.61 mg/dL — ABNORMAL HIGH (ref 0.44–1.00)
Creatinine, Ser: 1.31 mg/dL — ABNORMAL HIGH (ref 0.44–1.00)
GFR, Estimated: 36 mL/min — ABNORMAL LOW (ref >60)
GFR, Estimated: 45 mL/min — ABNORMAL LOW (ref >60)
Glucose, Bld: 76 mg/dL (ref 70–99)
Glucose, Bld: 111 mg/dL — ABNORMAL HIGH (ref 70–99)
Potassium: 4.6 mmol/L (ref 3.5–5.1)
Potassium: 4.2 mmol/L (ref 3.5–5.1)
Sodium: 138 mmol/L (ref 135–145)
Sodium: 138 mmol/L (ref 135–145)

## 2022-08-14 MED ORDER — ASPIRIN 81 MG PO TBEC
81.0000 mg | DELAYED_RELEASE_TABLET | Freq: Every day | ORAL | Status: DC
  Administered 2022-08-15 – 2022-08-18 (×3): 81 mg via ORAL
  Filled 2022-08-14 (×4): qty 1

## 2022-08-14 NOTE — TOC Initial Note (Signed)
Transition of Care [facility]) - Initial/Assessment Note    Patient Details  Name: Taylor Paul MRN: [id number] Date of Birth: 1955/09/23  Transition [facility] CM/SW Contact:    Gala Lewandowsky, RN Phone Number: 08/14/2022, 10:49 AM  Clinical Narrative: Patient presented for LHC-per notes attempted PCI on 08-13-22. Patient is managed by [facility] PCP. Patient states she goes to [employer] on MWF and she lives alone with the support of her son. Patient gets transportation from [place] [phone] and they send meds via [employer]. Case Manager will continue to follow for transition of care needs as the patient progresses.              Expected Discharge Plan: Home/Self Care Barriers to Discharge: No Barriers Identified   Patient Goals and CMS Choice Patient states their goals for this hospitalization and ongoing recovery are:: to return home.   Choice offered to / list presented to : NA  Expected Discharge Plan and Services Expected Discharge Plan: Home/Self Care In-house Referral: NA Discharge Planning Services: CM Consult   Living arrangements for the past 2 months: Apartment                   DME Agency: NA  Prior Living Arrangements/Services Living arrangements for the past 2 months: Apartment Lives with:: Self (son checks in on the patient.) Patient language and need for interpreter reviewed:: Yes Do you feel safe going back to the place where you live?: Yes      Need for Family Participation in Patient Care: Yes (Comment) Care giver support system in place?: Yes (comment)   Criminal Activity/Legal Involvement Pertinent to Current Situation/Hospitalization: No - Comment as needed  Permission Sought/Granted Permission sought to share information with : Family Supports, Case Manager     Emotional Assessment Appearance:: Appears stated age Attitude/Demeanor/Rapport: Engaged Affect (typically observed): Appropriate Orientation: : Oriented to  Situation, Oriented to  Time, Oriented to Place, Oriented to Self Alcohol / Substance Use: Not Applicable Psych Involvement: No (comment)  Admission diagnosis:  CAD in native artery [I25.10] Patient Active Problem List   Diagnosis Date Noted   CAD in native artery 08/13/2022   Angina pectoris (HCC) 08/09/2022   Coronary artery disease 04/27/2022   Nonischemic cardiomyopathy (HCC) 10/13/2021   Chest discomfort 12/23/2020   Shortness of breath 12/23/2020   Congestive heart disease (HCC)    COPD (chronic obstructive pulmonary disease) (HCC)    PAC (premature atrial contraction) 03/19/2020   NSVT (nonsustained ventricular tachycardia) (HCC) 03/19/2020   Paroxysmal SVT (supraventricular tachycardia) 03/19/2020   Sinus pause 03/19/2020   Depressed left ventricular ejection fraction 02/18/2020   PVC (premature ventricular contraction) 01/18/2020   Other chest pain 01/18/2020   Essential hypertension 01/18/2020   Mixed hyperlipidemia 01/18/2020   Preoperative clearance 01/18/2020   Smoker 01/18/2020   Hypertension    Encounter for screening colonoscopy 12/25/2019   Acute gastric ulcer with perforation (HCC) 06/05/2019   Postoperative examination 06/05/2019   PCP:  [facility] Pharmacy:  No Pharmacies Listed  Readmission Risk Interventions     No data to display

## 2022-08-14 NOTE — Progress Notes (Signed)
Rounding Note    Patient Name: Taylor Paul Date of Encounter: 08/14/2022  [facility] Cardiologist: Thomasene Ripple, DO   Subjective   Resting comfortably in bed.  Attempted PCI 08/13/2022 see details below.  Hydrating for repeat approach 1 day.  Inpatient Medications    Scheduled Meds:  aspirin EC  325 mg Oral Daily   atorvastatin  20 mg Oral Daily   carvedilol  6.25 mg Oral BID PC   clopidogrel  75 mg Oral Q breakfast   fluticasone furoate-vilanterol  1 puff Inhalation Daily   loratadine  10 mg Oral Daily   metoCLOPramide  5 mg Oral TID AC   pantoprazole  40 mg Oral Daily   sertraline  50 mg Oral Daily   sodium chloride flush  3 mL Intravenous Q12H   umeclidinium bromide  1 puff Inhalation Daily   Continuous Infusions:  sodium chloride 50 mL/hr at 08/14/22 0323   sodium chloride     PRN Meds: sodium chloride, acetaminophen, fluticasone, nitroGLYCERIN, ondansetron (ZOFRAN) IV, oxyCODONE, sodium chloride flush   Vital Signs    Vitals:   08/13/22 2024 08/14/22 0529 08/14/22 0828 08/14/22 0840  BP: 97/62 103/60 104/65   Pulse: 77 72 75 64  Resp: 18 18 20 18   Temp: 98.1 F (36.7 C) 97.7 F (36.5 C) (!) 97.4 F (36.3 C)   TempSrc: Oral Oral Oral   SpO2: 97% 94% 96% 95%  Weight:      Height:        Intake/Output Summary (Last 24 hours) at 08/14/2022 0942 Last data filed at 08/14/2022 0323 Gross per 24 hour  Intake 651.67 ml  Output --  Net 651.67 ml      08/13/2022    6:33 AM 08/09/2022    8:22 AM 04/27/2022   10:19 AM  Last 3 Weights  Weight (lbs) 95 lb 97 lb 3.2 oz 113 lb  Weight (kg) 43.092 kg 44.09 kg 51.256 kg      Telemetry    Sinus rhythm.  Brief run of atrial tachycardia- Personally Reviewed  ECG    No new- Personally Reviewed  Physical Exam   GEN: No acute distress.  Thin Neck: No JVD Cardiac: RRR, no murmurs, rubs, or gallops.  Radial site clean dry and intact Respiratory: Clear to auscultation bilaterally. GI: Soft,  nontender, non-distended  MS: No edema; No deformity. Neuro:  Nonfocal  Psych: Normal affect   Labs    High Sensitivity Troponin:  No results for input(s): "TROPONINIHS" in the last 720 hours.   Chemistry Recent Labs  Lab 08/09/22 0915  NA 139  K 4.7  CL 110*  CO2 11*  GLUCOSE 125*  BUN 33*  CREATININE 1.93*  CALCIUM 9.7    Lipids No results for input(s): "CHOL", "TRIG", "HDL", "LABVLDL", "LDLCALC", "CHOLHDL" in the last 168 hours.  Hematology Recent Labs  Lab 08/09/22 0915  WBC 10.0  RBC 5.17  HGB 15.8  HCT 46.6  MCV 90  MCH 30.6  MCHC 33.9  RDW 14.8  PLT 349   Thyroid No results for input(s): "TSH", "FREET4" in the last 168 hours.  BNPNo results for input(s): "BNP", "PROBNP" in the last 168 hours.  DDimer No results for input(s): "DDIMER" in the last 168 hours.   Radiology    CARDIAC CATHETERIZATION  Result Date: 08/13/2022   Mid LAD lesion is 99% stenosed.   Post intervention, there is a 99% residual stenosis. CONCLUSIONS: Widely patent left main with upward origin. Large  LAD that wraps around the left ventricular apex.  There is segmental 95% mid stenosis.  Proximal LAD contains calcified less than 50% narrowing.  Mid to distal LAD is collateralized from the right coronary Circumflex is a large without significant stenosis. Proximal to mid RCA with 70 to 75% stenosis.  Collaterals to mid to distal LAD noted.  Collaterals are faint. Normal LV function.  LVEDP 9 mmHg. Failed PCI due to inability to advance wire into LAD due to angulation and posterior origin of the left main. Excessive contrast used in the setting of CKD, placing the patient at risk for acute kidney injury. RECOMMENDATIONS: Hydration overnight Check kidney function in a.m. and if significant upward trend, kidney function will need to be monitored until the creatinine crests.  If kidney function remains reasonable, would be okay to discharge the patient home and I will have her come back for elective PCI  next Friday. Recommendation concerning CAD is an attempt at LAD PCI from femoral approach which may give [place] a better opportunity to wire the LAD and consider PCI of the RCA in a staged procedure if LAD PCI is complicated or require significant contrast.   Cardiac Studies   Cardiac catheterization 08/13/2022 Diagnostic Dominance: Right  Intervention    Patient Profile     66 y.o. female with tobacco use hypertension hyperlipidemia with progressive anginal symptoms who underwent cardiac catheterization which showed significant mid LAD lesion with right to left collaterals and moderate right coronary artery lesion supplying collaterals.  Attempted angioplasty failed due to posterior takeoff of vessel.  170 cc of contrast utilized.  Given chronic kidney disease, admitted for IV hydration with plan to use femoral approach perhaps 90 degree venture catheter to be able to direct the guidewire into the LAD.  Assessment & Plan    CAD unstable angina - As above hydrating for planned intervention on Monday via the femoral artery approach -On aspirin as well as Plavix currently. -Carvedilol 6.25 mg twice a day  Chronic kidney disease stage IIIa -Hydrating.  Creatinine 1.93 on 08/09/2022.  We will check basic metabolic profile.  Tobacco use - Counseled on cessation.  Hyperlipidemia - Continue with statin.  Atorvastatin 20 mg daily.  May need to increase to high intensity dose.  LDL goal less than 70.  Hypertension - Medication management for blood pressure control.  For questions or updates, please contact [facility] Please consult [email or web address] for contact info under        Signed, Donato Schultz, MD  08/14/2022, 9:42 AM

## 2022-08-14 NOTE — Progress Notes (Signed)
Kidney function is stable/improved compared to pre-cath values. Options for PCI are to remain in hospital and have attempted PCI from femoral approach on Monday by one of our colleagues. Don't know who is in lab Monday. The other option is to discharge tomorrow morning and return on Friday for me to attempt elcetive PCI (my next day in lab.). Since she has trouble getting back and forth from [place], she might prefer to remain in and get it done Monday.

## 2022-08-15 LAB — BASIC METABOLIC PANEL
Anion gap: 6 (ref 5–15)
BUN: 18 mg/dL (ref 8–23)
CO2: 17 mmol/L — ABNORMAL LOW (ref 22–32)
Calcium: 8.7 mg/dL — ABNORMAL LOW (ref 8.9–10.3)
Chloride: 116 mmol/L — ABNORMAL HIGH (ref 98–111)
Creatinine, Ser: 1.59 mg/dL — ABNORMAL HIGH (ref 0.44–1.00)
GFR, Estimated: 36 mL/min — ABNORMAL LOW (ref >60)
Glucose, Bld: 73 mg/dL (ref 70–99)
Potassium: 4.2 mmol/L (ref 3.5–5.1)
Sodium: 139 mmol/L (ref 135–145)

## 2022-08-15 LAB — LIPOPROTEIN A (LPA): Lipoprotein (a): 51 nmol/L — ABNORMAL HIGH (ref <75.0)

## 2022-08-15 MED ORDER — GUAIFENESIN-DM 100-10 MG/5ML PO SYRP
5.0000 mL | ORAL_SOLUTION | ORAL | Status: DC | PRN
  Administered 2022-08-15 – 2022-08-17 (×4): 5 mL via ORAL
  Filled 2022-08-15 (×4): qty 5

## 2022-08-15 MED ORDER — SODIUM CHLORIDE 0.9% FLUSH: 3.0000 mL | INTRAVENOUS | Status: DC | PRN

## 2022-08-15 MED ORDER — SODIUM CHLORIDE 0.9% FLUSH
3.0000 mL | Freq: Two times a day (BID) | INTRAVENOUS | Status: DC
  Administered 2022-08-16: 3 mL via INTRAVENOUS

## 2022-08-15 MED ORDER — ASPIRIN 81 MG PO CHEW
81.0000 mg | CHEWABLE_TABLET | ORAL | Status: AC
  Administered 2022-08-16: 81 mg via ORAL
  Filled 2022-08-15: qty 1

## 2022-08-15 MED ORDER — SODIUM CHLORIDE 0.9 % IV SOLN: 250.0000 mL | INTRAVENOUS | Status: DC | PRN

## 2022-08-15 MED ORDER — CARVEDILOL 3.125 MG PO TABS
3.1250 mg | ORAL_TABLET | Freq: Two times a day (BID) | ORAL | Status: DC
  Administered 2022-08-15 – 2022-08-18 (×5): 3.125 mg via ORAL
  Filled 2022-08-15 (×6): qty 1

## 2022-08-15 MED ORDER — SODIUM CHLORIDE 0.9 % IV SOLN: INTRAVENOUS | Status: DC

## 2022-08-15 NOTE — Progress Notes (Signed)
Rounding Note    Patient Name: Taylor Paul Date of Encounter: 08/15/2022  [facility] Cardiologist: Thomasene Ripple, DO   Subjective   Comfortable getting hydrated, prior attempted PCI on 08/13/2022 reviewed.  Comfortable once again in bed.  No shortness of breath, no chest pain.  Inpatient Medications    Scheduled Meds:  aspirin EC  81 mg Oral Daily   atorvastatin  20 mg Oral Daily   carvedilol  6.25 mg Oral BID PC   clopidogrel  75 mg Oral Q breakfast   fluticasone furoate-vilanterol  1 puff Inhalation Daily   loratadine  10 mg Oral Daily   metoCLOPramide  5 mg Oral TID AC   pantoprazole  40 mg Oral Daily   sertraline  50 mg Oral Daily   sodium chloride flush  3 mL Intravenous Q12H   umeclidinium bromide  1 puff Inhalation Daily   Continuous Infusions:  sodium chloride     PRN Meds: sodium chloride, acetaminophen, fluticasone, nitroGLYCERIN, ondansetron (ZOFRAN) IV, oxyCODONE, sodium chloride flush   Vital Signs    Vitals:   08/14/22 0840 08/14/22 1600 08/14/22 2115 08/15/22 0851  BP:  103/66 (!) 97/57   Pulse: 64 73 69 82  Resp: 18 20 18 18   Temp:  97.6 F (36.4 C) 97.9 F (36.6 C)   TempSrc:  Oral Oral   SpO2: 95% 95% 96% 96%  Weight:      Height:       No intake or output data in the 24 hours ending 08/15/22 0859     08/13/2022    6:33 AM 08/09/2022    8:22 AM 04/27/2022   10:19 AM  Last 3 Weights  Weight (lbs) 95 lb 97 lb 3.2 oz 113 lb  Weight (kg) 43.092 kg 44.09 kg 51.256 kg      Telemetry    Sinus rhythm.  Brief run of atrial tachycardia, unchanged- Personally Reviewed  ECG    No new- Personally Reviewed  Physical Exam   GEN: No acute distress.  Thin Neck: No JVD Cardiac: RRR, no murmurs, rubs, or gallops.  Radial site clean dry and intact unchanged Respiratory: Clear to auscultation bilaterally. GI: Soft, nontender, non-distended  MS: No edema; No deformity. Neuro:  Nonfocal  Psych: Normal affect   Labs    High  Sensitivity Troponin:  No results for input(s): "TROPONINIHS" in the last 720 hours.   Chemistry Recent Labs  Lab 08/14/22 0152 08/14/22 1653 08/15/22 0116  NA 138 138 139  K 4.6 4.2 4.2  CL 116* 115* 116*  CO2 17* 16* 17*  GLUCOSE 76 111* 73  BUN 21 17 18   CREATININE 1.61* 1.31* 1.59*  CALCIUM 8.7* 8.8* 8.7*  GFRNONAA 36* 45* 36*  ANIONGAP 5 7 6     Lipids No results for input(s): "CHOL", "TRIG", "HDL", "LABVLDL", "LDLCALC", "CHOLHDL" in the last 168 hours.  Hematology Recent Labs  Lab 08/09/22 0915  WBC 10.0  RBC 5.17  HGB 15.8  HCT 46.6  MCV 90  MCH 30.6  MCHC 33.9  RDW 14.8  PLT 349   Thyroid No results for input(s): "TSH", "FREET4" in the last 168 hours.  BNPNo results for input(s): "BNP", "PROBNP" in the last 168 hours.  DDimer No results for input(s): "DDIMER" in the last 168 hours.   Radiology    CARDIAC CATHETERIZATION  Result Date: 08/13/2022   Mid LAD lesion is 99% stenosed.   Post intervention, there is a 99% residual stenosis. CONCLUSIONS: Widely patent  left main with upward origin. Large LAD that wraps around the left ventricular apex.  There is segmental 95% mid stenosis.  Proximal LAD contains calcified less than 50% narrowing.  Mid to distal LAD is collateralized from the right coronary Circumflex is a large without significant stenosis. Proximal to mid RCA with 70 to 75% stenosis.  Collaterals to mid to distal LAD noted.  Collaterals are faint. Normal LV function.  LVEDP 9 mmHg. Failed PCI due to inability to advance wire into LAD due to angulation and posterior origin of the left main. Excessive contrast used in the setting of CKD, placing the patient at risk for acute kidney injury. RECOMMENDATIONS: Hydration overnight Check kidney function in a.m. and if significant upward trend, kidney function will need to be monitored until the creatinine crests.  If kidney function remains reasonable, would be okay to discharge the patient home and I will have her come  back for elective PCI next Friday. Recommendation concerning CAD is an attempt at LAD PCI from femoral approach which may give [place] a better opportunity to wire the LAD and consider PCI of the RCA in a staged procedure if LAD PCI is complicated or require significant contrast.   Cardiac Studies   Cardiac catheterization 08/13/2022 Diagnostic Dominance: Right  Intervention    Patient Profile     66 y.o. female with tobacco use hypertension hyperlipidemia with progressive anginal symptoms who underwent cardiac catheterization which showed significant mid LAD lesion with right to left collaterals and moderate right coronary artery lesion supplying collaterals.  Attempted angioplasty failed due to posterior takeoff of vessel.  170 cc of contrast utilized.  Given chronic kidney disease, admitted for IV hydration with plan to use femoral approach perhaps 90 degree venture catheter to be able to direct the guidewire into the LAD.  Assessment & Plan    CAD unstable angina 99% LAD lesion, 70% proximal RCA lesion - As above hydrating for planned intervention on Monday via the femoral artery approach.  Reviewed Dr. Michaelle Copas note.  She lives in [place].  We will go ahead and get done tomorrow. -On aspirin as well as Plavix currently. -Carvedilol now 3.125 mg twice a day  Chronic kidney disease stage IIIa -Hydrating.  Creatinine 1.93 on 08/09/2022.  Most recent 1.59  Tobacco use - Counseled on cessation.  Continue discussion.  Hyperlipidemia - Continue with statin.  Atorvastatin 20 mg daily.  May need to increase to high intensity dose.  LDL goal less than 70.  Unchanged  Hypertension - Slightly low blood pressure.  We will decrease carvedilol to 3.125 mg twice a day from 6.25 twice a day.  For questions or updates, please contact [facility] Please consult [email or web address] for contact info under        Signed, Donato Schultz, MD  08/15/2022, 8:59 AM

## 2022-08-16 ENCOUNTER — Encounter (HOSPITAL_COMMUNITY): Payer: Self-pay | Admitting: Interventional Cardiology

## 2022-08-16 ENCOUNTER — Encounter (HOSPITAL_COMMUNITY)
Admission: AD | Disposition: A | Discharge status: Home / Self Care | Payer: Self-pay | Source: Home / Self Care | Attending: Interventional Cardiology

## 2022-08-16 DIAGNOSIS — I25110 Atherosclerotic heart disease of native coronary artery with unstable angina pectoris: Principal | ICD-10-CM

## 2022-08-16 DIAGNOSIS — E785 Hyperlipidemia, unspecified: Secondary | ICD-10-CM

## 2022-08-16 DIAGNOSIS — Z720 Tobacco use: Secondary | ICD-10-CM

## 2022-08-16 HISTORY — PX: INTRAVASCULAR PRESSURE WIRE/FFR STUDY: CATH118243

## 2022-08-16 HISTORY — PX: CORONARY STENT INTERVENTION: CATH118234

## 2022-08-16 LAB — CBC
HCT: 28.2 % — ABNORMAL LOW (ref 36.0–46.0)
Hemoglobin: 9.4 g/dL — ABNORMAL LOW (ref 12.0–15.0)
MCH: 30.9 pg (ref 26.0–34.0)
MCHC: 33.3 g/dL (ref 30.0–36.0)
MCV: 92.8 fL (ref 80.0–100.0)
Platelets: 229 10*3/uL (ref 150–400)
RBC: 3.04 MIL/uL — ABNORMAL LOW (ref 3.87–5.11)
RDW: 16.7 % — ABNORMAL HIGH (ref 11.5–15.5)
WBC: 5.6 10*3/uL (ref 4.0–10.5)
nRBC: 0 % (ref 0.0–0.2)

## 2022-08-16 LAB — BASIC METABOLIC PANEL
Anion gap: 6 (ref 5–15)
BUN: 22 mg/dL (ref 8–23)
CO2: 16 mmol/L — ABNORMAL LOW (ref 22–32)
Calcium: 8.2 mg/dL — ABNORMAL LOW (ref 8.9–10.3)
Chloride: 116 mmol/L — ABNORMAL HIGH (ref 98–111)
Creatinine, Ser: 1.31 mg/dL — ABNORMAL HIGH (ref 0.44–1.00)
GFR, Estimated: 45 mL/min — ABNORMAL LOW (ref >60)
Glucose, Bld: 79 mg/dL (ref 70–99)
Potassium: 4.2 mmol/L (ref 3.5–5.1)
Sodium: 138 mmol/L (ref 135–145)

## 2022-08-16 LAB — POCT ACTIVATED CLOTTING TIME
Activated Clotting Time: 363 seconds
Activated Clotting Time: 217 seconds
Activated Clotting Time: 228 seconds
Activated Clotting Time: 201 seconds

## 2022-08-16 LAB — LIPID PANEL
Cholesterol: 109 mg/dL (ref 0–200)
HDL: 33 mg/dL — ABNORMAL LOW (ref >40)
LDL Cholesterol: 54 mg/dL (ref 0–99)
Total CHOL/HDL Ratio: 3.3 RATIO
Triglycerides: 112 mg/dL (ref <150)
VLDL: 22 mg/dL (ref 0–40)

## 2022-08-16 LAB — CREATININE, SERUM
Creatinine, Ser: 1.06 mg/dL — ABNORMAL HIGH (ref 0.44–1.00)
GFR, Estimated: 59 mL/min — ABNORMAL LOW (ref >60)

## 2022-08-16 SURGERY — CORONARY STENT INTERVENTION
Anesthesia: LOCAL

## 2022-08-16 MED ORDER — HEPARIN (PORCINE) IN NACL 1000-0.9 UT/500ML-% IV SOLN
INTRAVENOUS | Status: AC
  Filled 2022-08-16: qty 1000

## 2022-08-16 MED ORDER — SODIUM CHLORIDE 0.9% FLUSH
3.0000 mL | Freq: Two times a day (BID) | INTRAVENOUS | Status: DC
  Administered 2022-08-16 – 2022-08-18 (×4): 3 mL via INTRAVENOUS

## 2022-08-16 MED ORDER — FENTANYL CITRATE (PF) 100 MCG/2ML IJ SOLN
INTRAMUSCULAR | Status: DC | PRN
  Administered 2022-08-16 (×2): 12.5 ug via INTRAVENOUS

## 2022-08-16 MED ORDER — MIDAZOLAM HCL 2 MG/2ML IJ SOLN
INTRAMUSCULAR | Status: AC
  Filled 2022-08-16: qty 2

## 2022-08-16 MED ORDER — LABETALOL HCL 5 MG/ML IV SOLN: 10.0000 mg | INTRAVENOUS | Status: AC | PRN

## 2022-08-16 MED ORDER — SODIUM CHLORIDE 0.9 % IV SOLN
INTRAVENOUS | Status: DC | PRN
  Administered 2022-08-16: 1.75 mg/kg/h via INTRAVENOUS

## 2022-08-16 MED ORDER — SODIUM CHLORIDE 0.9% FLUSH: 3.0000 mL | INTRAVENOUS | Status: DC | PRN

## 2022-08-16 MED ORDER — SODIUM CHLORIDE 0.9 % IV SOLN: INTRAVENOUS | Status: AC

## 2022-08-16 MED ORDER — SODIUM CHLORIDE 0.9 % IV SOLN: 250.0000 mL | INTRAVENOUS | Status: DC | PRN

## 2022-08-16 MED ORDER — LIDOCAINE HCL (PF) 1 % IJ SOLN
INTRAMUSCULAR | Status: AC
  Filled 2022-08-16: qty 30

## 2022-08-16 MED ORDER — LIDOCAINE HCL (PF) 1 % IJ SOLN
INTRAMUSCULAR | Status: DC | PRN
  Administered 2022-08-16: 10 mL

## 2022-08-16 MED ORDER — HEPARIN (PORCINE) IN NACL 1000-0.9 UT/500ML-% IV SOLN
INTRAVENOUS | Status: DC | PRN
  Administered 2022-08-16 (×2): 500 mL

## 2022-08-16 MED ORDER — IOHEXOL 350 MG/ML SOLN
INTRAVENOUS | Status: DC | PRN
  Administered 2022-08-16: 60 mL

## 2022-08-16 MED ORDER — HYDRALAZINE HCL 20 MG/ML IJ SOLN: 10.0000 mg | INTRAMUSCULAR | Status: AC | PRN

## 2022-08-16 MED ORDER — NITROGLYCERIN 1 MG/10 ML FOR IR/CATH LAB
INTRA_ARTERIAL | Status: DC | PRN
  Administered 2022-08-16 (×3): 100 ug via INTRACORONARY

## 2022-08-16 MED ORDER — FENTANYL CITRATE (PF) 100 MCG/2ML IJ SOLN
INTRAMUSCULAR | Status: AC
  Filled 2022-08-16: qty 2

## 2022-08-16 MED ORDER — HEPARIN SODIUM (PORCINE) 5000 UNIT/ML IJ SOLN
5000.0000 [IU] | Freq: Three times a day (TID) | INTRAMUSCULAR | Status: DC
  Administered 2022-08-17 – 2022-08-18 (×2): 5000 [IU] via SUBCUTANEOUS
  Filled 2022-08-16 (×2): qty 1

## 2022-08-16 MED ORDER — BIVALIRUDIN BOLUS VIA INFUSION - CUPID
INTRAVENOUS | Status: DC | PRN
  Administered 2022-08-16: 35.625 mg via INTRAVENOUS

## 2022-08-16 MED ORDER — ATORVASTATIN CALCIUM 40 MG PO TABS
40.0000 mg | ORAL_TABLET | Freq: Every day | ORAL | Status: DC
  Administered 2022-08-16 – 2022-08-18 (×3): 40 mg via ORAL
  Filled 2022-08-16 (×3): qty 1

## 2022-08-16 MED ORDER — MIDAZOLAM HCL 2 MG/2ML IJ SOLN
INTRAMUSCULAR | Status: DC | PRN
  Administered 2022-08-16 (×2): .5 mg via INTRAVENOUS

## 2022-08-16 MED ORDER — NITROGLYCERIN 1 MG/10 ML FOR IR/CATH LAB
INTRA_ARTERIAL | Status: AC
  Filled 2022-08-16: qty 10

## 2022-08-16 SURGICAL SUPPLY — 22 items
BALLN SAPPHIRE 2.0X12 (BALLOONS) ×2
BALLN ~~LOC~~ EMERGE MR 2.5X15 (BALLOONS) ×1
BALLOON SAPPHIRE 2.0X12 (BALLOONS) IMPLANT
BALLOON ~~LOC~~ EMERGE MR 2.5X15 (BALLOONS) IMPLANT
CATH LAUNCHER 6FR JR4 (CATHETERS) IMPLANT
CATH VISTA GUIDE 6FR XBLAD3.5 (CATHETERS) IMPLANT
ELECT DEFIB PAD ADLT CADENCE (PAD) IMPLANT
GUIDEWIRE PRESSURE X 175 (WIRE) IMPLANT
KIT ENCORE 26 ADVANTAGE (KITS) IMPLANT
KIT HEART LEFT (KITS) ×1 IMPLANT
KIT MICROPUNCTURE NIT STIFF (SHEATH) IMPLANT
PACK CARDIAC CATHETERIZATION (CUSTOM PROCEDURE TRAY) ×1 IMPLANT
SHEATH PINNACLE 6F 10CM (SHEATH) IMPLANT
SHEATH PROBE COVER 6X72 (BAG) IMPLANT
STENT SYNERGY XD 2.25X28 (Permanent Stent) IMPLANT
STENT SYNERGY XD 2.25X8 (Permanent Stent) IMPLANT
SYNERGY XD 2.25X28 (Permanent Stent) ×1 IMPLANT
SYNERGY XD 2.25X8 (Permanent Stent) ×1 IMPLANT
TRANSDUCER W/STOPCOCK (MISCELLANEOUS) ×1 IMPLANT
TUBING CIL FLEX 10 FLL-RA (TUBING) ×1 IMPLANT
WIRE EMERALD 3MM-J .035X150CM (WIRE) IMPLANT
WIRE RUNTHROUGH .014X180CM (WIRE) IMPLANT

## 2022-08-16 NOTE — Progress Notes (Addendum)
Rounding Note    Patient Name: Taylor Paul Date of Encounter: 08/16/2022  [facility] Cardiologist: Thomasene Ripple, DO   Subjective   Denies any CP or SOB.   Inpatient Medications    Scheduled Meds:  aspirin EC  81 mg Oral Daily   atorvastatin  20 mg Oral Daily   carvedilol  3.125 mg Oral BID PC   clopidogrel  75 mg Oral Q breakfast   fluticasone furoate-vilanterol  1 puff Inhalation Daily   loratadine  10 mg Oral Daily   metoCLOPramide  5 mg Oral TID AC   pantoprazole  40 mg Oral Daily   sertraline  50 mg Oral Daily   sodium chloride flush  3 mL Intravenous Q12H   sodium chloride flush  3 mL Intravenous Q12H   umeclidinium bromide  1 puff Inhalation Daily   Continuous Infusions:  sodium chloride     sodium chloride     sodium chloride 50 mL/hr at 08/16/22 0611   PRN Meds: sodium chloride, sodium chloride, acetaminophen, fluticasone, guaiFENesin-dextromethorphan, nitroGLYCERIN, ondansetron (ZOFRAN) IV, oxyCODONE, sodium chloride flush, sodium chloride flush   Vital Signs    Vitals:   08/15/22 0858 08/15/22 1650 08/15/22 2205 08/16/22 0421  BP: 109/64 115/70 131/67 111/70  Pulse: 78 77 74 74  Resp: 18 16 16 18   Temp: 97.6 F (36.4 C) 97.7 F (36.5 C) (!) 97.5 F (36.4 C) 97.8 F (36.6 C)  TempSrc: Oral Oral Oral Oral  SpO2: 96% 98% 96% 99%  Weight:    47.5 kg  Height:        Intake/Output Summary (Last 24 hours) at 08/16/2022 0751 Last data filed at 08/16/2022 14/12/2021 Gross per 24 hour  Intake 507.2 ml  Output 1 ml  Net 506.2 ml      08/16/2022    4:21 AM 08/13/2022    6:33 AM 08/09/2022    8:22 AM  Last 3 Weights  Weight (lbs) 104 lb 11.2 oz 95 lb 97 lb 3.2 oz  Weight (kg) 47.492 kg 43.092 kg 44.09 kg      Telemetry    NSR without significant ventricular ectopy - Personally Reviewed  ECG    No new EKG - Personally Reviewed  Physical Exam   GEN: No acute distress.   Neck: No JVD Cardiac: RRR, no murmurs, rubs, or gallops.   Respiratory: Clear to auscultation bilaterally. GI: Soft, nontender, non-distended  MS: No edema; No deformity. Neuro:  Nonfocal  Psych: Normal affect   Labs    High Sensitivity Troponin:  No results for input(s): "TROPONINIHS" in the last 720 hours.   Chemistry Recent Labs  Lab 08/14/22 1653 08/15/22 0116 08/16/22 0241  NA 138 139 138  K 4.2 4.2 4.2  CL 115* 116* 116*  CO2 16* 17* 16*  GLUCOSE 111* 73 79  BUN 17 18 22   CREATININE 1.31* 1.59* 1.31*  CALCIUM 8.8* 8.7* 8.2*  GFRNONAA 45* 36* 45*  ANIONGAP 7 6 6     Lipids No results for input(s): "CHOL", "TRIG", "HDL", "LABVLDL", "LDLCALC", "CHOLHDL" in the last 168 hours.  Hematology Recent Labs  Lab 08/09/22 0915  WBC 10.0  RBC 5.17  HGB 15.8  HCT 46.6  MCV 90  MCH 30.6  MCHC 33.9  RDW 14.8  PLT 349   Thyroid No results for input(s): "TSH", "FREET4" in the last 168 hours.  BNPNo results for input(s): "BNP", "PROBNP" in the last 168 hours.  DDimer No results for input(s): "DDIMER" in the  last 168 hours.   Radiology    No results found.  Cardiac Studies   Cath 08/13/2022   Mid LAD lesion is 99% stenosed.   Post intervention, there is a 99% residual stenosis.   CONCLUSIONS: Widely patent left main with upward origin. Large LAD that wraps around the left ventricular apex.  There is segmental 95% mid stenosis.  Proximal LAD contains calcified less than 50% narrowing.  Mid to distal LAD is collateralized from the right coronary Circumflex is a large without significant stenosis. Proximal to mid RCA with 70 to 75% stenosis.  Collaterals to mid to distal LAD noted.  Collaterals are faint. Normal LV function.  LVEDP 9 mmHg. Failed PCI due to inability to advance wire into LAD due to angulation and posterior origin of the left main. Excessive contrast used in the setting of CKD, placing the patient at risk for acute kidney injury.   RECOMMENDATIONS: Hydration overnight Check kidney function in a.m. and if  significant upward trend, kidney function will need to be monitored until the creatinine crests.  If kidney function remains reasonable, would be okay to discharge the patient home and I will have her come back for elective PCI next Friday. Recommendation concerning CAD is an attempt at LAD PCI from femoral approach which may give [place] a better opportunity to wire the LAD and consider PCI of the RCA in a staged procedure if LAD PCI is complicated or require significant contrast.    Patient Profile     66 y.o. female with PMH of CAD, HTN, HLD and NSVT presented for outpatient catherization scheduled by Dr. Tomie China given recent chest pain.   Assessment & Plan    CAD  - cath 08/13/2022 showed 99% mid LAD lesion, 50% prox LAD, 70-75% lesion in prox to mid RCA with collaterals to mid and distal LAD. PCI of mid LAD failed due to inability to advance the wire into LAD.  -although initial plan was to discharge over the weekend and staged PCI this Friday, however patient's renal function was quite stable despite contrast use, therefore the decision was made to stay in the hospital for PCI from femoral approach today  - risk and benefit of the procedure explained to the patient, she is agreeable to proceed  HTN: BP controlled on coreg  HLD: on lipitor, increase lipitor doseage  Tobacco abuse: tobacco cessation discussed, patient currently smoke 1/2 pack per day, but willing to quit  CKD: Cr 1.3  For questions or updates, please contact [facility] Please consult [email or web address] for contact info under        Signed, Azalee Course, PA  08/16/2022, 7:51 AM    Patient seen, examined. Available data reviewed. Agree with findings, assessment, and plan as outlined by Azalee Course, PA. On my exam the patient is alert, oriented, in NAD. Lungs CTA, heart RRR no murmur, abdomen soft, NT, right radial site clear, right femoral pulse 3+, no pretibial edema. Cath films reviewed. Pt on ASA, carvedilol,  atorvastatin, and clopidogrel. I have reviewed risks, indications and alternatives to staged PCI of the LAD via right femoral access. Consider 2 vessel PCI with treatment of the mid RCA as well pending operator discretion. Pt understands and agrees to proceed. Tobacco cessation counseling done.   Tonny Bollman, M.D. 08/16/2022 9:22 AM

## 2022-08-16 NOTE — Interval H&P Note (Signed)
History and Physical Interval Note:  08/16/2022 12:34 PM  Taylor Paul  has presented today for surgery, with the diagnosis of accelerating anginia.  The various methods of treatment have been discussed with the patient and family. After consideration of risks, benefits and other options for treatment, the patient has consented to  Procedure(s): CORONARY STENT INTERVENTION (N/A) as a surgical intervention.  The patient's history has been reviewed, patient examined, no change in status, stable for surgery.  I have reviewed the patient's chart and labs.  Questions were answered to the patient's satisfaction.    Cath Lab Visit (complete for each Cath Lab visit)  Clinical Evaluation Leading to the Procedure:   ACS: No.  Non-ACS:    Anginal Classification: CCS IV  Anti-ischemic medical therapy: Minimal Therapy (1 class of medications)  Non-Invasive Test Results: High-risk stress test findings: cardiac mortality >3%/year  Prior CABG: No previous CABG  Taylor Paul

## 2022-08-16 NOTE — TOC Progression Note (Signed)
Transition of Care [facility]) - Progression Note    Patient Details  Name: Taylor Paul MRN: [id number] Date of Birth: 03/12/1956  Transition of Care [facility]) CM/SW Contact  Graves-Bigelow, Lamar Laundry, RN Phone Number: 08/16/2022, 12:56 PM  Clinical Narrative:  Case Manager spoke with [place] Care Coordinator at [place] in [place] [phone]. [place] states they can provide transportation home tomorrow as long as the Case Manager calls in the morning with updates on disposition. Case Manager will continue to follow for transition of care needs as the patient progresses.    Expected Discharge Plan: Home/Self Care Barriers to Discharge: No Barriers Identified  Expected Discharge Plan and Services Expected Discharge Plan: Home/Self Care In-house Referral: NA Discharge Planning Services: CM Consult   Living arrangements for the past 2 months: Apartment                   DME Agency: NA   Readmission Risk Interventions     No data to display

## 2022-08-16 NOTE — Progress Notes (Signed)
Site area: Right groin a 6 french arterial sheath was removed  Site Prior to Removal:  Level 0  Pressure Applied For 20 MINUTES    Bedrest Beginning at 1745p X 4 hours  Manual:   Yes.    Patient Status During Pull:  stable  Post Pull Groin Site:  Level 0  Post Pull Instructions Given:  Yes.    Post Pull Pulses Present:  Yes.    Dressing Applied:  Yes.    Comments:

## 2022-08-16 NOTE — H&P (View-Only) (Signed)
Rounding Note    Patient Name: Taylor Paul Date of Encounter: 08/16/2022  [facility] Cardiologist: Thomasene Ripple, DO   Subjective   Denies any CP or SOB.   Inpatient Medications    Scheduled Meds:  aspirin EC  81 mg Oral Daily   atorvastatin  20 mg Oral Daily   carvedilol  3.125 mg Oral BID PC   clopidogrel  75 mg Oral Q breakfast   fluticasone furoate-vilanterol  1 puff Inhalation Daily   loratadine  10 mg Oral Daily   metoCLOPramide  5 mg Oral TID AC   pantoprazole  40 mg Oral Daily   sertraline  50 mg Oral Daily   sodium chloride flush  3 mL Intravenous Q12H   sodium chloride flush  3 mL Intravenous Q12H   umeclidinium bromide  1 puff Inhalation Daily   Continuous Infusions:  sodium chloride     sodium chloride     sodium chloride 50 mL/hr at 08/16/22 0611   PRN Meds: sodium chloride, sodium chloride, acetaminophen, fluticasone, guaiFENesin-dextromethorphan, nitroGLYCERIN, ondansetron (ZOFRAN) IV, oxyCODONE, sodium chloride flush, sodium chloride flush   Vital Signs    Vitals:   08/15/22 0858 08/15/22 1650 08/15/22 2205 08/16/22 0421  BP: 109/64 115/70 131/67 111/70  Pulse: 78 77 74 74  Resp: 18 16 16 18   Temp: 97.6 F (36.4 C) 97.7 F (36.5 C) (!) 97.5 F (36.4 C) 97.8 F (36.6 C)  TempSrc: Oral Oral Oral Oral  SpO2: 96% 98% 96% 99%  Weight:    47.5 kg  Height:        Intake/Output Summary (Last 24 hours) at 08/16/2022 0751 Last data filed at 08/16/2022 14/12/2021 Gross per 24 hour  Intake 507.2 ml  Output 1 ml  Net 506.2 ml      08/16/2022    4:21 AM 08/13/2022    6:33 AM 08/09/2022    8:22 AM  Last 3 Weights  Weight (lbs) 104 lb 11.2 oz 95 lb 97 lb 3.2 oz  Weight (kg) 47.492 kg 43.092 kg 44.09 kg      Telemetry    NSR without significant ventricular ectopy - Personally Reviewed  ECG    No new EKG - Personally Reviewed  Physical Exam   GEN: No acute distress.   Neck: No JVD Cardiac: RRR, no murmurs, rubs, or gallops.   Respiratory: Clear to auscultation bilaterally. GI: Soft, nontender, non-distended  MS: No edema; No deformity. Neuro:  Nonfocal  Psych: Normal affect   Labs    High Sensitivity Troponin:  No results for input(s): "TROPONINIHS" in the last 720 hours.   Chemistry Recent Labs  Lab 08/14/22 1653 08/15/22 0116 08/16/22 0241  NA 138 139 138  K 4.2 4.2 4.2  CL 115* 116* 116*  CO2 16* 17* 16*  GLUCOSE 111* 73 79  BUN 17 18 22   CREATININE 1.31* 1.59* 1.31*  CALCIUM 8.8* 8.7* 8.2*  GFRNONAA 45* 36* 45*  ANIONGAP 7 6 6     Lipids No results for input(s): "CHOL", "TRIG", "HDL", "LABVLDL", "LDLCALC", "CHOLHDL" in the last 168 hours.  Hematology Recent Labs  Lab 08/09/22 0915  WBC 10.0  RBC 5.17  HGB 15.8  HCT 46.6  MCV 90  MCH 30.6  MCHC 33.9  RDW 14.8  PLT 349   Thyroid No results for input(s): "TSH", "FREET4" in the last 168 hours.  BNPNo results for input(s): "BNP", "PROBNP" in the last 168 hours.  DDimer No results for input(s): "DDIMER" in the  last 168 hours.   Radiology    No results found.  Cardiac Studies   Cath 08/13/2022   Mid LAD lesion is 99% stenosed.   Post intervention, there is a 99% residual stenosis.   CONCLUSIONS: Widely patent left main with upward origin. Large LAD that wraps around the left ventricular apex.  There is segmental 95% mid stenosis.  Proximal LAD contains calcified less than 50% narrowing.  Mid to distal LAD is collateralized from the right coronary Circumflex is a large without significant stenosis. Proximal to mid RCA with 70 to 75% stenosis.  Collaterals to mid to distal LAD noted.  Collaterals are faint. Normal LV function.  LVEDP 9 mmHg. Failed PCI due to inability to advance wire into LAD due to angulation and posterior origin of the left main. Excessive contrast used in the setting of CKD, placing the patient at risk for acute kidney injury.   RECOMMENDATIONS: Hydration overnight Check kidney function in a.m. and if  significant upward trend, kidney function will need to be monitored until the creatinine crests.  If kidney function remains reasonable, would be okay to discharge the patient home and I will have her come back for elective PCI next Friday. Recommendation concerning CAD is an attempt at LAD PCI from femoral approach which may give [place] a better opportunity to wire the LAD and consider PCI of the RCA in a staged procedure if LAD PCI is complicated or require significant contrast.    Patient Profile     66 y.o. female with PMH of CAD, HTN, HLD and NSVT presented for outpatient catherization scheduled by Dr. Tomie China given recent chest pain.   Assessment & Plan    CAD  - cath 08/13/2022 showed 99% mid LAD lesion, 50% prox LAD, 70-75% lesion in prox to mid RCA with collaterals to mid and distal LAD. PCI of mid LAD failed due to inability to advance the wire into LAD.  -although initial plan was to discharge over the weekend and staged PCI this Friday, however patient's renal function was quite stable despite contrast use, therefore the decision was made to stay in the hospital for PCI from femoral approach today  - risk and benefit of the procedure explained to the patient, she is agreeable to proceed  HTN: BP controlled on coreg  HLD: on lipitor, increase lipitor doseage  Tobacco abuse: tobacco cessation discussed, patient currently smoke 1/2 pack per day, but willing to quit  CKD: Cr 1.3  For questions or updates, please contact [facility] Please consult [email or web address] for contact info under        Signed, Azalee Course, PA  08/16/2022, 7:51 AM    Patient seen, examined. Available data reviewed. Agree with findings, assessment, and plan as outlined by Azalee Course, PA. On my exam the patient is alert, oriented, in NAD. Lungs CTA, heart RRR no murmur, abdomen soft, NT, right radial site clear, right femoral pulse 3+, no pretibial edema. Cath films reviewed. Pt on ASA, carvedilol,  atorvastatin, and clopidogrel. I have reviewed risks, indications and alternatives to staged PCI of the LAD via right femoral access. Consider 2 vessel PCI with treatment of the mid RCA as well pending operator discretion. Pt understands and agrees to proceed. Tobacco cessation counseling done.   Tonny Bollman, M.D. 08/16/2022 9:22 AM

## 2022-08-17 ENCOUNTER — Encounter (HOSPITAL_COMMUNITY): Payer: Self-pay | Admitting: Internal Medicine

## 2022-08-17 DIAGNOSIS — I25119 Atherosclerotic heart disease of native coronary artery with unspecified angina pectoris: Secondary | ICD-10-CM

## 2022-08-17 LAB — BASIC METABOLIC PANEL
Anion gap: 4 — ABNORMAL LOW (ref 5–15)
BUN: 22 mg/dL (ref 8–23)
CO2: 18 mmol/L — ABNORMAL LOW (ref 22–32)
Calcium: 8.4 mg/dL — ABNORMAL LOW (ref 8.9–10.3)
Chloride: 117 mmol/L — ABNORMAL HIGH (ref 98–111)
Creatinine, Ser: 1.5 mg/dL — ABNORMAL HIGH (ref 0.44–1.00)
GFR, Estimated: 39 mL/min — ABNORMAL LOW (ref >60)
Glucose, Bld: 85 mg/dL (ref 70–99)
Potassium: 3.9 mmol/L (ref 3.5–5.1)
Sodium: 139 mmol/L (ref 135–145)

## 2022-08-17 LAB — CBC
HCT: 29.1 % — ABNORMAL LOW (ref 36.0–46.0)
Hemoglobin: 9.1 g/dL — ABNORMAL LOW (ref 12.0–15.0)
MCH: 29.8 pg (ref 26.0–34.0)
MCHC: 31.3 g/dL (ref 30.0–36.0)
MCV: 95.4 fL (ref 80.0–100.0)
Platelets: 229 10*3/uL (ref 150–400)
RBC: 3.05 MIL/uL — ABNORMAL LOW (ref 3.87–5.11)
RDW: 16.8 % — ABNORMAL HIGH (ref 11.5–15.5)
WBC: 6.3 10*3/uL (ref 4.0–10.5)
nRBC: 0 % (ref 0.0–0.2)

## 2022-08-17 MED FILL — Nitroglycerin IV Soln 100 MCG/ML in D5W: INTRA_ARTERIAL | Qty: 10 | Status: AC

## 2022-08-17 NOTE — Progress Notes (Addendum)
CARDIAC REHAB PHASE I   PRE:  Rate/Rhythm: 75 NSR  BP:  Sitting: 133/77      SaO2: 98 RA  MODE:  Ambulation: 470 ft   POST:  Rate/Rhythm: 96 NSR  BP:  Sitting: 125/78      SaO2: 97 RA  Pt ambulated the hallway, independent after set-up. Denies CP and SOB, RPE 9. Pt educated on stent location, asa and plavix, risk factors (smoking), wt restrictions, no baths/daily wash-ups, s/s of infection, ex guidelines (progressive walking + progressive resistance exercise), s/s to stop exercising, ntg use and calling 911, and CRPII. Pt is currently active, attends [facility] [place] 3x/week. Pt still interested in CRPII, will refer to [place].   [id number]  Taylor Paul  8:55 AM 08/17/2022

## 2022-08-17 NOTE — Care Management Important Message (Signed)
Important Message  Patient Details  Name: Taylor Paul MRN: [id number] Date of Birth: 1956/01/01   Medicare Important Message Given:  Yes     Renie Ora 08/17/2022, 9:43 AM

## 2022-08-17 NOTE — Progress Notes (Signed)
Mobility Specialist - Progress Note   08/17/22 1546  Mobility  Activity Ambulated with assistance in hallway  Level of Assistance Standby assist, set-up cues, supervision of patient - no hands on  Assistive Device None  Distance Ambulated (ft) 100 ft  Activity Response Tolerated well  Mobility Referral Yes  $Mobility charge 1 Mobility   Pt received in bed and agreeable to session. No complaints throughout. Pt was returned to EOB with all needs met.   Franki Monte  Mobility Specialist Please contact via Solicitor or Rehab office at [phone]

## 2022-08-17 NOTE — Progress Notes (Addendum)
Rounding Note    Patient Name: Taylor Paul Date of Encounter: 08/17/2022  [facility] Cardiologist: Thomasene Ripple, DO   Subjective   Denies any CP or SOB.   Inpatient Medications    Scheduled Meds:  aspirin EC  81 mg Oral Daily   atorvastatin  40 mg Oral Daily   carvedilol  3.125 mg Oral BID PC   clopidogrel  75 mg Oral Q breakfast   fluticasone furoate-vilanterol  1 puff Inhalation Daily   heparin  5,000 Units Subcutaneous Q8H   loratadine  10 mg Oral Daily   metoCLOPramide  5 mg Oral TID AC   pantoprazole  40 mg Oral Daily   sertraline  50 mg Oral Daily   sodium chloride flush  3 mL Intravenous Q12H   sodium chloride flush  3 mL Intravenous Q12H   umeclidinium bromide  1 puff Inhalation Daily   Continuous Infusions:  sodium chloride     PRN Meds: sodium chloride, fluticasone, guaiFENesin-dextromethorphan, nitroGLYCERIN, ondansetron (ZOFRAN) IV, oxyCODONE, sodium chloride flush, sodium chloride flush   Vital Signs    Vitals:   08/16/22 2000 08/16/22 2017 08/17/22 0503 08/17/22 0725  BP: 125/73 115/73 115/76   Pulse: 66 72 67   Resp:  16    Temp:  97.6 F (36.4 C) (!) 97.5 F (36.4 C)   TempSrc:  Oral Oral   SpO2: 96% 96% 96% 96%  Weight:      Height:        Intake/Output Summary (Last 24 hours) at 08/17/2022 0755 Last data filed at 08/17/2022 0300 Gross per 24 hour  Intake 12.41 ml  Output --  Net 12.41 ml      08/16/2022    4:21 AM 08/13/2022    6:33 AM 08/09/2022    8:22 AM  Last 3 Weights  Weight (lbs) 104 lb 11.2 oz 95 lb 97 lb 3.2 oz  Weight (kg) 47.492 kg 43.092 kg 44.09 kg      Telemetry    NSR without significant ventricular ectopy - Personally Reviewed  ECG    NSR with LBBB - Personally Reviewed  Physical Exam   GEN: No acute distress.   Neck: No JVD Cardiac: RRR, no murmurs, rubs, or gallops.  Respiratory: Clear to auscultation bilaterally. GI: Soft, nontender, non-distended  MS: No edema; No deformity. Neuro:   Nonfocal  Psych: Normal affect   Labs    High Sensitivity Troponin:  No results for input(s): "TROPONINIHS" in the last 720 hours.   Chemistry Recent Labs  Lab 08/15/22 0116 08/16/22 0241 08/16/22 1854 08/17/22 0323  NA 139 138  --  139  K 4.2 4.2  --  3.9  CL 116* 116*  --  117*  CO2 17* 16*  --  18*  GLUCOSE 73 79  --  85  BUN 18 22  --  22  CREATININE 1.59* 1.31* 1.06* 1.50*  CALCIUM 8.7* 8.2*  --  8.4*  GFRNONAA 36* 45* 59* 39*  ANIONGAP 6 6  --  4*    Lipids  Recent Labs  Lab 08/16/22 0241  CHOL 109  TRIG 112  HDL 33*  LDLCALC 54  CHOLHDL 3.3    Hematology Recent Labs  Lab 08/16/22 1854 08/17/22 0323  WBC 5.6 6.3  RBC 3.04* 3.05*  HGB 9.4* 9.1*  HCT 28.2* 29.1*  MCV 92.8 95.4  MCH 30.9 29.8  MCHC 33.3 31.3  RDW 16.7* 16.8*  PLT 229 229   Thyroid No results  for input(s): "TSH", "FREET4" in the last 168 hours.  BNPNo results for input(s): "BNP", "PROBNP" in the last 168 hours.  DDimer No results for input(s): "DDIMER" in the last 168 hours.   Radiology    CARDIAC CATHETERIZATION  Result Date: 08/17/2022 Conclusions: Severe single-vessel coronary artery disease with long segment of up to 99% stenosis and TIMI-1 flow involving the LAD. Moderate (~70%) proximal to mid RCA stenosis that is not hemodynamically significant (RFR = 0.96). Successful PCI to proximal-mid LAD using overlapping Synergy 2.25 x 28 mm and 2.25 x 8 mm drug-eluting stents (postdilated to 2.6 mm) with 0% residual stenosis and TIMI-3 flow. Recommendations: Dual antiplatelet therapy with aspirin and clopidogrel for at least 6 months, ideally 12 months or longer. Aggressive secondary prevention of coronary artery disease and medical management of non-critical RCA disease. Remove right femoral artery sheath with manual compression 2 hours after discontinuation of bivalirudin. Nelva Bush, MD [facility]   Cardiac Studies   Cath 08/13/2022   Mid LAD lesion is 99% stenosed.   Post  intervention, there is a 99% residual stenosis.   CONCLUSIONS: Widely patent left main with upward origin. Large LAD that wraps around the left ventricular apex.  There is segmental 95% mid stenosis.  Proximal LAD contains calcified less than 50% narrowing.  Mid to distal LAD is collateralized from the right coronary Circumflex is a large without significant stenosis. Proximal to mid RCA with 70 to 75% stenosis.  Collaterals to mid to distal LAD noted.  Collaterals are faint. Normal LV function.  LVEDP 9 mmHg. Failed PCI due to inability to advance wire into LAD due to angulation and posterior origin of the left main. Excessive contrast used in the setting of CKD, placing the patient at risk for acute kidney injury.   RECOMMENDATIONS: Hydration overnight Check kidney function in a.m. and if significant upward trend, kidney function will need to be monitored until the creatinine crests.  If kidney function remains reasonable, would be okay to discharge the patient home and I will have her come back for elective PCI next Friday. Recommendation concerning CAD is an attempt at LAD PCI from femoral approach which may give [place] a better opportunity to wire the LAD and consider PCI of the RCA in a staged procedure if LAD PCI is complicated or require significant contrast.    Patient Profile     66 y.o. female with PMH of CAD, HTN, HLD and NSVT presented for outpatient catherization scheduled by Dr. Geraldo Pitter given recent chest pain.   Assessment & Plan    CAD             - cath 08/13/2022 showed 99% mid LAD lesion, 50% prox LAD, 70-75% lesion in prox to mid RCA with collaterals to mid and distal LAD. PCI of mid LAD failed due to inability to advance the wire into LAD.             -although initial plan was to discharge over the weekend and staged PCI this Friday, however patient's renal function was quite stable despite contrast use, therefore the decision was made to stay in the hospital for PCI from  femoral approach on 12/4             - Cath 08/16/2022 Synergy 2.25 x 28 mm and 2.25 x 28mm DES to prox and mid LAD. Medical management of RCA   HTN: BP controlled on coreg   HLD: on lipitor, increase lipitor doseage   Tobacco abuse: tobacco  cessation discussed, patient currently smoke 1/2 pack per day, but willing to quit   CKD: Cr 1.3 --> 1.06 --> 1.5. Cr trended up slightly post cath, will defer to MD to decide if need to repeat BMET this afternoon prior to DC or keep patient until tomorrow.   For questions or updates, please contact [place] Please consult [email or web address] for contact info under   Signed, Almyra Deforest, [place]  08/17/2022, 7:55 AM    Patient seen, examined. Available data reviewed. Agree with findings, assessment, and plan as outlined by Almyra Deforest, PA-C.  Patient is alert, oriented, in no distress.  HEENT is normal, neck with normal JVP, lungs are clear, heart is regular rate rhythm no murmur gallop, abdomen soft and nontender, extremities are without edema, the right groin site is clear with no hematoma or ecchymosis.  The patient did very well with LAD PCI yesterday.  Her creatinine has increased to 1.5 mg/dL today.  This may just be her current trend as it is too early for contrast nephropathy, but I would favor keeping her until tomorrow morning with a repeat creatinine when she is at 48 hours out from PCI.  She understands and is in agreement.  As long as her creatinine is stable, we will try to get her discharged early tomorrow.  Otherwise as outlined above, medical therapy for residual RCA stenosis which was nonhemodynamically significant, DAPT for complex LAD stenosis for period of 12 months without interruption.  Sherren Mocha, M.D. 08/17/2022 12:00 PM

## 2022-08-18 ENCOUNTER — Other Ambulatory Visit (HOSPITAL_COMMUNITY): Payer: Self-pay

## 2022-08-18 LAB — BASIC METABOLIC PANEL
Anion gap: 4 — ABNORMAL LOW (ref 5–15)
BUN: 19 mg/dL (ref 8–23)
CO2: 19 mmol/L — ABNORMAL LOW (ref 22–32)
Calcium: 8.3 mg/dL — ABNORMAL LOW (ref 8.9–10.3)
Chloride: 113 mmol/L — ABNORMAL HIGH (ref 98–111)
Creatinine, Ser: 1.25 mg/dL — ABNORMAL HIGH (ref 0.44–1.00)
GFR, Estimated: 48 mL/min — ABNORMAL LOW (ref >60)
Glucose, Bld: 86 mg/dL (ref 70–99)
Potassium: 3.4 mmol/L — ABNORMAL LOW (ref 3.5–5.1)
Sodium: 136 mmol/L (ref 135–145)

## 2022-08-18 MED ORDER — NICOTINE 14 MG/24HR TD PT24
14.0000 mg | MEDICATED_PATCH | TRANSDERMAL | 0 refills | Status: AC
  Filled 2022-08-18: qty 28, 28d supply, fill #0

## 2022-08-18 MED ORDER — GUAIFENESIN-DM 100-10 MG/5ML PO SYRP
5.0000 mL | ORAL_SOLUTION | ORAL | 0 refills | Status: AC | PRN
  Filled 2022-08-18: qty 118, 4d supply, fill #0

## 2022-08-18 MED ORDER — ATORVASTATIN CALCIUM 40 MG PO TABS
40.0000 mg | ORAL_TABLET | Freq: Every day | ORAL | 11 refills | Status: AC
  Filled 2022-08-18: qty 30, 30d supply, fill #0

## 2022-08-18 MED ORDER — DEXTROMETHORPHAN POLISTIREX ER 30 MG/5ML PO SUER: 60.0000 mg | Freq: Two times a day (BID) | ORAL | Status: DC

## 2022-08-18 MED ORDER — CLOPIDOGREL BISULFATE 75 MG PO TABS
75.0000 mg | ORAL_TABLET | Freq: Every day | ORAL | 11 refills | Status: AC
  Filled 2022-08-18: qty 30, 30d supply, fill #0

## 2022-08-18 MED ORDER — GUAIFENESIN ER 600 MG PO TB12: 600.0000 mg | ORAL_TABLET | Freq: Two times a day (BID) | ORAL | Status: DC

## 2022-08-18 NOTE — Discharge Summary (Signed)
Discharge Summary    Patient ID: Taylor Paul MRN: [id number]; DOB: 01-21-1956  Admit date: 08/13/2022 Discharge date: 08/18/2022  PCP:  [facility] [place] [facility] Cardiologist:  Berniece Salines, DO     Discharge Diagnoses    Principal Problem:   Accelerating angina [facility]) Active Problems:   Smoker   Depressed left ventricular ejection fraction   NSVT (nonsustained ventricular tachycardia) (HCC)   COPD (chronic obstructive pulmonary disease) ([place])   Coronary artery disease   CAD in native artery  Diagnostic Studies/Procedures    Cath: 08/13/22    Mid LAD lesion is 99% stenosed.   Post intervention, there is a 99% residual stenosis.   CONCLUSIONS: Widely patent left main with upward origin. Large LAD that wraps around the left ventricular apex.  There is segmental 95% mid stenosis.  Proximal LAD contains calcified less than 50% narrowing.  Mid to distal LAD is collateralized from the right coronary Circumflex is a large without significant stenosis. Proximal to mid RCA with 70 to 75% stenosis.  Collaterals to mid to distal LAD noted.  Collaterals are faint. Normal LV function.  LVEDP 9 mmHg. Failed PCI due to inability to advance wire into LAD due to angulation and posterior origin of the left main. Excessive contrast used in the setting of CKD, placing the patient at risk for acute kidney injury.   RECOMMENDATIONS: Hydration overnight Check kidney function in a.m. and if significant upward trend, kidney function will need to be monitored until the creatinine crests.  If kidney function remains reasonable, would be okay to discharge the patient home and I will have her come back for elective PCI next Friday. Recommendation concerning CAD is an attempt at LAD PCI from femoral approach which may give [place] a better opportunity to wire the LAD and consider PCI of the RCA in a staged procedure if LAD PCI is complicated or require significant  contrast.  Diagnostic Dominance: Right  Intervention    Cath: 08/16/22  Conclusions: Severe single-vessel coronary artery disease with long segment of up to 99% stenosis and TIMI-1 flow involving the LAD. Moderate (~70%) proximal to mid RCA stenosis that is not hemodynamically significant (RFR = 0.96). Successful PCI to proximal-mid LAD using overlapping Synergy 2.25 x 28 mm and 2.25 x 8 mm drug-eluting stents (postdilated to 2.6 mm) with 0% residual stenosis and TIMI-3 flow.   Recommendations: Dual antiplatelet therapy with aspirin and clopidogrel for at least 6 months, ideally 12 months or longer. Aggressive secondary prevention of coronary artery disease and medical management of non-critical RCA disease. Remove right femoral artery sheath with manual compression 2 hours after discontinuation of bivalirudin.   Nelva Bush, MD [facility]  Diagnostic Dominance: Right  Intervention   _____________   History of Present Illness     Taylor Paul is a 66 y.o. female with PMH of CAD but coronary CT, HTN, HLD, COPD who presented to the office on 11/27 with Dr. Geraldo Pitter with complaints of chest pain. Had a stress test at [facility] which was abnormal. At office visit, continued to report exertional angina episodes. Set up for outpatient cardiac cath.   Hospital Course   CAD -- Underwent cardiac catheterization 08/13/2022 which showed 99% mid LAD lesion, 50% proximal LAD, 70 to 75% proximal/mid CAD with collaterals to mid to distal LAD.  Failed attempt at PCI of mid LAD due to inability to advance wire or cross lesion.  Initial plan was to discharge the following day  with staged PCI the following week.  However patient's renal function able despite contrast use therefore decision was made to remain in the hospital for PCI via femoral approach.  Underwent cardiac cath 12/4 with successful PCI/DES x1 to proximal/mid LAD with medical management of 70% proximal RCA lesion.   Recommendation for DAPT with aspirin/Plavix for at least 1 year. Seen by cardiac rehab. -- Continue aspirin, Plavix, Coreg, atorvastatin  HTN -- Well-controlled -- Continue Coreg  HLD -- HDL 33, LDL 54 -- atorvastatin was increased from 20 mg to 40 mg daily -- Will need LFT/FLP in 8 weeks  Tobacco use -- Cessation advised -- nicotine patches at DC  CKD  -- Creatinine has been up and down ranging from 1.06 up to 1.50 and now down to 1.25.  Appears to be stable and patient medically stable for discharge  General: Well developed, well nourished, female appearing in no acute distress. Head: Normocephalic, atraumatic.  Neck: Supple without bruits, JVD absent. Lungs:  Resp regular and unlabored, expiratory rhonchi bilaterally Heart: RRR, S1, S2, no S3, S4, or murmur; no rub. Abdomen: Soft, non-tender, non-distended with normoactive bowel sounds. No hepatomegaly. No rebound/guarding. No obvious abdominal masses. Extremities: No clubbing, cyanosis, edema. Distal pedal pulses are 2+ bilaterally.  Right radial cath site stable without bruising or hematoma Neuro: Alert and oriented X 3. Moves all extremities spontaneously. Psych: Normal affect.  Did the patient have an acute coronary syndrome (MI, NSTEMI, STEMI, etc) this admission?:  Yes                               AHA/ACC Clinical Performance & Quality Measures: Aspirin prescribed? - Yes ADP Receptor Inhibitor (Plavix/Clopidogrel, Brilinta/Ticagrelor or Effient/Prasugrel) prescribed (includes medically managed patients)? - Yes Beta Blocker prescribed? - Yes High Intensity Statin (Lipitor 40-55m or Crestor 20-414m prescribed? - Yes EF assessed during THIS hospitalization? - Yes For EF <40%, was ACEI/ARB prescribed? - Not Applicable (EF >/= 4092%For EF <40%, Aldosterone Antagonist (Spironolactone or Eplerenone) prescribed? - Not Applicable (EF >/= 4033%Cardiac Rehab Phase II ordered (including medically managed patients)? - Yes    The patient will be scheduled for a TOC follow up appointment in 10-14 days.  [facility] at the office where the patient should be seen for follow up.  _____________  Discharge Vitals Blood pressure 121/71, pulse 64, temperature 97.8 F (36.6 C), temperature source Oral, resp. rate 18, height 5' 3" (1.6 m), weight 47.5 kg, SpO2 96 %.  Filed Weights   08/13/22 0633 08/16/22 0421  Weight: 43.1 kg 47.5 kg    Labs & Radiologic Studies    CBC Recent Labs    08/16/22 1854 08/17/22 0323  WBC 5.6 6.3  HGB 9.4* 9.1*  HCT 28.2* 29.1*  MCV 92.8 95.4  PLT 229 22007 Basic Metabolic Panel Recent Labs    08/17/22 0323 08/18/22 0812  NA 139 136  K 3.9 3.4*  CL 117* 113*  CO2 18* 19*  GLUCOSE 85 86  BUN 22 19  CREATININE 1.50* 1.25*  CALCIUM 8.4* 8.3*   Liver Function Tests No results for input(s): "AST", "ALT", "ALKPHOS", "BILITOT", "PROT", "ALBUMIN" in the last 72 hours. No results for input(s): "LIPASE", "AMYLASE" in the last 72 hours. High Sensitivity Troponin:   No results for input(s): "TROPONINIHS" in the last 720 hours.  BNP Invalid input(s): "POCBNP" D-Dimer No results for  input(s): "DDIMER" in the last 72 hours. Hemoglobin A1C No results for input(s): "HGBA1C" in the last 72 hours. Fasting Lipid Panel Recent Labs    08/16/22 0241  CHOL 109  HDL 33*  LDLCALC 54  TRIG 112  CHOLHDL 3.3   Thyroid Function Tests No results for input(s): "TSH", "T4TOTAL", "T3FREE", "THYROIDAB" in the last 72 hours.  Invalid input(s): "FREET3" _____________  CARDIAC CATHETERIZATION  Result Date: 08/17/2022 Conclusions: Severe single-vessel coronary artery disease with long segment of up to 99% stenosis and TIMI-1 flow involving the LAD. Moderate (~70%) proximal to mid RCA stenosis that is not hemodynamically significant (RFR = 0.96). Successful PCI to proximal-mid LAD using overlapping Synergy 2.25 x 28 mm and 2.25 x 8 mm  drug-eluting stents (postdilated to 2.6 mm) with 0% residual stenosis and TIMI-3 flow. Recommendations: Dual antiplatelet therapy with aspirin and clopidogrel for at least 6 months, ideally 12 months or longer. Aggressive secondary prevention of coronary artery disease and medical management of non-critical RCA disease. Remove right femoral artery sheath with manual compression 2 hours after discontinuation of bivalirudin. Nelva Bush, MD [facility]  CARDIAC CATHETERIZATION  Result Date: 08/13/2022   Mid LAD lesion is 99% stenosed.   Post intervention, there is a 99% residual stenosis. CONCLUSIONS: Widely patent left main with upward origin. Large LAD that wraps around the left ventricular apex.  There is segmental 95% mid stenosis.  Proximal LAD contains calcified less than 50% narrowing.  Mid to distal LAD is collateralized from the right coronary Circumflex is a large without significant stenosis. Proximal to mid RCA with 70 to 75% stenosis.  Collaterals to mid to distal LAD noted.  Collaterals are faint. Normal LV function.  LVEDP 9 mmHg. Failed PCI due to inability to advance wire into LAD due to angulation and posterior origin of the left main. Excessive contrast used in the setting of CKD, placing the patient at risk for acute kidney injury. RECOMMENDATIONS: Hydration overnight Check kidney function in a.m. and if significant upward trend, kidney function will need to be monitored until the creatinine crests.  If kidney function remains reasonable, would be okay to discharge the patient home and I will have her come back for elective PCI next Friday. Recommendation concerning CAD is an attempt at LAD PCI from femoral approach which may give [place] a better opportunity to wire the LAD and consider PCI of the RCA in a staged procedure if LAD PCI is complicated or require significant contrast.  Disposition   Pt is being discharged home today in good condition.  Follow-up Plans & Appointments      Follow-up Information     Revankar, Reita Cliche, MD Follow up on 09/24/2022.   Specialty: Cardiology Why: at 9:20am for your follow up appt Contact information: [place] [address] [phone]                Discharge Instructions     Amb Referral to Cardiac Rehabilitation   Complete by: As directed    Oval Linsey   Diagnosis: Coronary Stents   After initial evaluation and assessments completed: Virtual Based Care may be provided alone or in conjunction with Phase 2 Cardiac Rehab based on patient barriers.: Yes   Intensive Cardiac Rehabilitation (ICR) [place] location only OR Traditional Cardiac Rehabilitation (TCR) *If criteria for ICR are not met will enroll in TCR [facility] only): Yes   Call MD for:  difficulty breathing, headache or visual disturbances   Complete by: As directed  Call MD for:  persistant dizziness or light-headedness   Complete by: As directed    Call MD for:  redness, tenderness, or signs of infection (pain, swelling, redness, odor or green/yellow discharge around incision site)   Complete by: As directed    Diet - low sodium heart healthy   Complete by: As directed    Discharge instructions   Complete by: As directed    PLEASE DO NOT [place]!!!!! Also keep a log of you blood pressures and bring back to your follow up appt. Please call the office with any questions.   Patients taking blood thinners should generally stay away from medicines like ibuprofen, Advil, Motrin, naproxen, and Aleve due to risk of stomach bleeding. You may take Tylenol as directed or talk to your primary doctor about alternatives.  PLEASE ENSURE THAT YOU DO NOT RUN OUT OF YOUR PLAVIX. This medication is very important to remain on for at least one year. IF you have issues obtaining this medication due to cost please CALL the office 3-5 business days prior to running out in order to prevent missing doses of this medication.   Increase activity slowly    Complete by: As directed         Discharge Medications   Allergies as of 08/18/2022       Reactions   Amoxicillin Rash   Tramadol Rash        Medication List     STOP taking these medications    meloxicam 7.5 MG tablet Commonly known as: MOBIC   triamcinolone acetonide 40 MG/ML injection Commonly known as: KENALOG-40       TAKE these medications    acetaminophen 500 MG tablet Commonly known as: TYLENOL Take 1,000 mg by mouth every 6 (six) hours as needed (Pain).   aspirin EC 81 MG tablet Take 81 mg by mouth daily.   atorvastatin 40 MG tablet Commonly known as: LIPITOR Take 1 tablet (40 mg total) by mouth daily. Start taking on: August 19, 2022 What changed:  medication strength how much to take   busPIRone 5 MG tablet Commonly known as: BUSPAR Take 5-10 mg by mouth every 8 (eight) hours as needed (Anxiety).   carvedilol 6.25 MG tablet Commonly known as: COREG TAKE 1 TABLET BY MOUTH 2 TIMES DAILY   clopidogrel 75 MG tablet Commonly known as: PLAVIX Take 1 tablet (75 mg total) by mouth daily with breakfast. Start taking on: August 19, 2022   dextromethorphan 30 MG/5ML liquid Commonly known as: Delsym Take 10 mLs (60 mg total) by mouth 2 (two) times daily. Start taking on: August 24, 2022 What changed:  medication strength how much to take when to take this These instructions start on August 24, 2022. If you are unsure what to do until then, ask your doctor or other care provider.   diclofenac Sodium 1 % Gel Commonly known as: VOLTAREN Apply 2 g topically 4 (four) times daily. Apply to Neck   fexofenadine 60 MG tablet Commonly known as: ALLEGRA Take 60 mg by mouth 2 (two) times daily.   fluticasone 50 MCG/ACT nasal spray Commonly known as: FLONASE Place 1 spray into both nostrils 2 (two) times daily as needed for rhinitis.   furosemide 40 MG tablet Commonly known as: LASIX Take 40 mg by mouth daily.   guaiFENesin 600 MG 12 hr  tablet Commonly known as: MUCINEX Take 1 tablet (600 mg total) by mouth every 12 (twelve) hours. Start taking on:  August 24, 2022 What changed: These instructions start on August 24, 2022. If you are unsure what to do until then, ask your doctor or other care provider.   guaiFENesin-dextromethorphan 100-10 MG/5ML syrup Commonly known as: ROBITUSSIN DM Take 5 mLs by mouth every 4 (four) hours as needed for cough.   hydrocortisone cream 1 % Apply 1 Application topically daily as needed (Allergic reaction).   metoCLOPramide 5 MG tablet Commonly known as: REGLAN Take 5 mg by mouth 3 (three) times daily before meals. 30 minutes before meals for stomach   montelukast 10 MG tablet Commonly known as: SINGULAIR Take 10 mg by mouth at bedtime.   Muscle Rub 10-15 % Crea Apply 1 Application topically 3 (three) times daily. Apply to both knees and affected area   nicotine 14 mg/24hr Paul Commonly known as: NICODERM CQ - dosed in mg/24 hours Place 1 Paul (14 mg total) onto the skin daily.   nitroGLYCERIN 0.4 MG SL tablet Commonly known as: NITROSTAT DISSOLVE 1 TABLET UNDER TONGUE EVERY 5 MINUTES AS NEEDED FOR CHEST PAIN. IF NO RELIEF AFTER 3 DOSES CALL 911.   ondansetron 4 MG tablet Commonly known as: ZOFRAN Take 4 mg by mouth every 8 (eight) hours as needed for nausea or vomiting.   pantoprazole 40 MG tablet Commonly known as: PROTONIX Take 40 mg by mouth daily.   potassium chloride 20 MEQ packet Commonly known as: KLOR-CON Take 20 mEq by mouth 2 (two) times daily.   PreserVision AREDS 2 Caps Take 1 capsule by mouth 2 (two) times daily.   sertraline 50 MG tablet Commonly known as: ZOLOFT Take 50 mg by mouth daily.   TRELEGY ELLIPTA IN Inhale 1 puff into the lungs daily.           Outstanding Labs/Studies   FLP/LFTs in 8 weeks   Duration of Discharge Encounter   Greater than 30 minutes including physician time.  Signed, Reino Bellis, NP 08/18/2022, 12:22  PM   Patient seen, examined. Available data reviewed. Agree with findings, assessment, and plan as outlined by Reino Bellis, NP.  The patient is independently interviewed and examined.  She is alert, oriented, no distress.  JVP is normal, lungs are clear bilaterally with the exception of some mild expiratory rhonchi, heart is regular rate and rhythm with no murmur or gallop, right radial site is clear with no hematoma or ecchymosis, lower extremities have no edema, skin is warm and dry without rash.  Her creatinine is improved today now 48 hours from PCI.  She is medically stable for discharge on medical therapy outlined above.  She should remain on DAPT with aspirin and clopidogrel through 12 months without interruption.  Tolerating beta-blocker and high intensity statin drug.  Outpatient follow-up will be arranged as above.  Medically stable for hospital discharge today.  Sherren Mocha, M.D. 08/18/2022 12:22 PM

## 2022-08-18 NOTE — TOC Transition Note (Addendum)
Transition of Care [facility]) - CM/SW Discharge Note   Patient Details  Name: Taylor Paul MRN: [id number] Date of Birth: 1956/07/30  Transition of Care [facility]) CM/SW Contact:  Gala Lewandowsky, RN Phone Number: 08/18/2022, 12:31 PM   Clinical Narrative: Patient will transition home today. Case Manager called [place] with [employer] and she is arranging transportation for this patient. Case Manager awaiting call back for ETA for transportation. Patient will receive PT/OT with [facility] in [place]. No further needs identified at this time.   1247 08-18-22 Patient states that she has family to provide transportation home. No further needs identified at this time.    Barriers to Discharge: No Barriers Identified   Patient Goals and CMS Choice Patient states their goals for this hospitalization and ongoing recovery are:: to return home.   Choice offered to / list presented to : NA  Discharge Plan and Services In-house Referral: NA Discharge Planning Services: CM Consult              DME Agency: NA   Readmission Risk Interventions     No data to display

## 2022-08-18 NOTE — Progress Notes (Signed)
Mobility Specialist - Progress Note   08/18/22 0949  Mobility  Activity Ambulated with assistance in hallway  Level of Assistance Standby assist, set-up cues, supervision of patient - no hands on  Assistive Device None  Distance Ambulated (ft) 360 ft  Activity Response Tolerated well  Mobility Referral Yes  $Mobility charge 1 Mobility   Pt received in bed and agreeable to mobility. No complaints throughout. Pt was returned to EOB with all needs met.  Taylor Paul  Mobility Specialist Please contact via Solicitor or Rehab office at [phone]

## 2022-08-30 ENCOUNTER — Other Ambulatory Visit (HOSPITAL_COMMUNITY): Payer: Self-pay

## 2022-09-24 ENCOUNTER — Encounter: Payer: Self-pay | Admitting: Cardiology

## 2022-09-24 ENCOUNTER — Ambulatory Visit: Payer: Medicare Other | Attending: Cardiology | Admitting: Cardiology

## 2022-09-24 VITALS — BP 118/70 | HR 70 | Ht 63.0 in | Wt 105.0 lb

## 2022-09-24 DIAGNOSIS — I10 Essential (primary) hypertension: Secondary | ICD-10-CM | POA: Diagnosis not present

## 2022-09-24 DIAGNOSIS — R0602 Shortness of breath: Secondary | ICD-10-CM | POA: Diagnosis not present

## 2022-09-24 DIAGNOSIS — F17200 Nicotine dependence, unspecified, uncomplicated: Secondary | ICD-10-CM | POA: Insufficient documentation

## 2022-09-24 DIAGNOSIS — I2510 Atherosclerotic heart disease of native coronary artery without angina pectoris: Secondary | ICD-10-CM | POA: Insufficient documentation

## 2022-09-24 DIAGNOSIS — E782 Mixed hyperlipidemia: Secondary | ICD-10-CM | POA: Diagnosis not present

## 2022-09-24 NOTE — Progress Notes (Signed)
Cardiology Office Note:    Date:  09/24/2022   ID:  Taylor Paul, DOB [birth year], MRN [id number]  PCP:  Care, [facility]  Cardiologist:  Jenean Lindau, MD   Referring MD: Care, [facility] Senior    ASSESSMENT:    1. Coronary artery disease involving native coronary artery of native heart without angina pectoris   2. Essential hypertension   3. Mixed hyperlipidemia   4. Shortness of breath   5. Smoker    PLAN:    In order of problems listed above:  Coronary artery disease: Secondary prevention stressed with the patient.  Importance of compliance with diet medication stressed and she vocalized understanding.  She was advised to walk at least half an hour every day and she promises to do so. Cigarette smoker: I spent 5 minutes with the patient discussing solely about smoking. Smoking cessation was counseled. I suggested to the patient also different medications and pharmacological interventions. Patient is keen to try stopping on its own at this time. He will get back to me if he needs any further assistance in this matter. Essential hypertension: Blood pressure stable and diet was emphasized. Mixed dyslipidemia: On lipid-lowering medications she will be back in the next few days for complete blood work.  Lifestyle modification and diet emphasized. Hypokalemia: Mildly abnormal potassium and we will check it when she comes for blood work next week Patient will be seen in follow-up appointment in 6 months or earlier if the patient has any concerns    Medication Adjustments/Labs and Tests Ordered: Current medicines are reviewed at length with the patient today.  Concerns regarding medicines are outlined above.  No orders of the defined types were placed in this encounter.  No orders of the defined types were placed in this encounter.    No chief complaint on file.    History of Present Illness:    Taylor Paul is a 67 y.o. female.  Patient was evaluated by me for  angina and abnormal nuclear stress test.  Coronary angiography was significantly positive and details are mentioned below.  Patient is happy since coronary angiography.  She can walk better she can breathe better no chest pain orthopnea or PND.  At the time of my evaluation, the patient is alert awake oriented and in no distress.  Unfortunately she continues to smoke.  She is now on a patch and smokes only 1 cigarettes a day and plans to quit soon.  Past Medical History:  Diagnosis Date   Accelerating angina ([place]) 08/09/2022   Acute gastric ulcer with perforation ([place]) 06/05/2019   CAD in native artery 08/13/2022   Chest discomfort 12/23/2020   Congestive heart disease ([place])    COPD (chronic obstructive pulmonary disease) ([place])    Coronary artery disease 04/27/2022   Depressed left ventricular ejection fraction 02/18/2020   Encounter for screening colonoscopy 12/25/2019   Essential hypertension 01/18/2020   Hypertension    Mixed hyperlipidemia 01/18/2020   Nonischemic cardiomyopathy ([place]) 10/13/2021   NSVT (nonsustained ventricular tachycardia) ([place]) 03/19/2020   Other chest pain 01/18/2020   PAC (premature atrial contraction) 03/19/2020   Paroxysmal SVT (supraventricular tachycardia) 03/19/2020   Postoperative examination 06/05/2019   Preoperative clearance 01/18/2020   PVC (premature ventricular contraction) 01/18/2020   Shortness of breath 12/23/2020   Sinus pause 03/19/2020   Smoker 01/18/2020    Past Surgical History:  Procedure Laterality Date   APPENDECTOMY     CORONARY STENT INTERVENTION N/A 08/13/2022   Procedure: CORONARY STENT  INTERVENTION;  Surgeon: Belva Crome, MD;  Location: [place] [facility];  Service: Cardiovascular;  Laterality: N/A;   CORONARY STENT INTERVENTION N/A 08/16/2022   Procedure: CORONARY STENT INTERVENTION;  Surgeon: Nelva Bush, MD;  Location: [place] [facility];  Service: Cardiovascular;  Laterality: N/A;   INTRAVASCULAR PRESSURE WIRE/FFR  STUDY N/A 08/16/2022   Procedure: INTRAVASCULAR PRESSURE WIRE/FFR STUDY;  Surgeon: Nelva Bush, MD;  Location: [place] [facility];  Service: Cardiovascular;  Laterality: N/A;   LEFT HEART CATH AND CORONARY ANGIOGRAPHY N/A 08/13/2022   Procedure: LEFT HEART CATH AND CORONARY ANGIOGRAPHY;  Surgeon: Belva Crome, MD;  Location: [facility];  Service: Cardiovascular;  Laterality: N/A;   STOMACH SURGERY     Perforated gastric ulcer    Current Medications: Current Meds  Medication Sig   acetaminophen (TYLENOL) 500 MG tablet Take 1,000 mg by mouth every 6 (six) hours as needed (Pain).   aspirin EC 81 MG tablet Take 81 mg by mouth daily.   atorvastatin (LIPITOR) 40 MG tablet Take 1 tablet (40 mg total) by mouth daily.   busPIRone (BUSPAR) 5 MG tablet Take 5-10 mg by mouth every 8 (eight) hours as needed (Anxiety).   carvedilol (COREG) 6.25 MG tablet TAKE 1 TABLET BY MOUTH 2 TIMES DAILY   clopidogrel (PLAVIX) 75 MG tablet Take 1 tablet (75 mg total) by mouth daily with breakfast.   dextromethorphan (DELSYM) 30 MG/5ML liquid Take 10 mLs (60 mg total) by mouth 2 (two) times daily.   diclofenac Sodium (VOLTAREN) 1 % GEL Apply 2 g topically 4 (four) times daily. Apply to Neck   fexofenadine (ALLEGRA) 60 MG tablet Take 60 mg by mouth 2 (two) times daily.   fluticasone (FLONASE) 50 MCG/ACT nasal spray Place 1 spray into both nostrils 2 (two) times daily as needed for rhinitis.   Fluticasone-Umeclidin-Vilant (TRELEGY ELLIPTA IN) Inhale 1 puff into the lungs daily.   furosemide (LASIX) 40 MG tablet Take 40 mg by mouth daily.   guaiFENesin (MUCINEX) 600 MG 12 hr tablet Take 1 tablet (600 mg total) by mouth every 12 (twelve) hours.   guaiFENesin-dextromethorphan (ROBITUSSIN DM) 100-10 MG/5ML syrup Take 5 mLs by mouth every 4 (four) hours as needed for cough.   hydrocortisone cream 1 % Apply 1 Application topically daily as needed (Allergic reaction).   Menthol-Methyl Salicylate (MUSCLE RUB) 10-15  % CREA Apply 1 Application topically 3 (three) times daily. Apply to both knees and affected area   metoCLOPramide (REGLAN) 5 MG tablet Take 5 mg by mouth 3 (three) times daily before meals. 30 minutes before meals for stomach   montelukast (SINGULAIR) 10 MG tablet Take 10 mg by mouth at bedtime.   Multiple Vitamins-Minerals (PRESERVISION AREDS 2) CAPS Take 1 capsule by mouth 2 (two) times daily.   nicotine (NICODERM CQ - DOSED IN MG/24 HOURS) 14 mg/24hr patch Place 1 patch (14 mg total) onto the skin daily.   nitroGLYCERIN (NITROSTAT) 0.4 MG SL tablet DISSOLVE 1 TABLET UNDER TONGUE EVERY 5 MINUTES AS NEEDED FOR CHEST PAIN. IF NO RELIEF AFTER 3 DOSES CALL 911.   ondansetron (ZOFRAN) 4 MG tablet Take 4 mg by mouth every 8 (eight) hours as needed for nausea or vomiting.   pantoprazole (PROTONIX) 40 MG tablet Take 40 mg by mouth daily.   potassium chloride (KLOR-CON) 20 MEQ packet Take 20 mEq by mouth 2 (two) times daily.   sertraline (ZOLOFT) 50 MG tablet Take 50 mg by mouth daily.  Allergies:   Amoxicillin and Tramadol   Social History   Socioeconomic History   Marital status: Single    Spouse name: Not on file   Number of children: Not on file   Years of education: Not on file   Highest education level: Not on file  Occupational History   Not on file  Tobacco Use   Smoking status: Every Day   Smokeless tobacco: Never  Substance and Sexual Activity   Alcohol use: Never   Drug use: Never   Sexual activity: Not on file  Other Topics Concern   Not on file  Social History Narrative   Not on file   Social Determinants of Health   Financial Resource Strain: Not on file  Food Insecurity: Not on file  Transportation Needs: Not on file  Physical Activity: Not on file  Stress: Not on file  Social Connections: Not on file     Family History: The patient's family history includes Congestive Heart Failure in her father; Diabetes in her brother, father, and mother; Heart disease in  her brother; Hypertension in her brother, father, and mother; Stroke in her brother.  ROS:   Please see the history of present illness.    All other systems reviewed and are negative.  EKGs/Labs/Other Studies Reviewed:    The following studies were reviewed today: Conclusions: Severe single-vessel coronary artery disease with long segment of up to 99% stenosis and TIMI-1 flow involving the LAD. Moderate (~70%) proximal to mid RCA stenosis that is not hemodynamically significant (RFR = 0.96). Successful PCI to proximal-mid LAD using overlapping Synergy 2.25 x 28 mm and 2.25 x 8 mm drug-eluting stents (postdilated to 2.6 mm) with 0% residual stenosis and TIMI-3 flow.   Recommendations: Dual antiplatelet therapy with aspirin and clopidogrel for at least 6 months, ideally 12 months or longer. Aggressive secondary prevention of coronary artery disease and medical management of non-critical RCA disease. Remove right femoral artery sheath with manual compression 2 hours after discontinuation of bivalirudin.   Yvonne Kendall, MD [facility]   Recent Labs: 08/17/2022: Hemoglobin 9.1; Platelets 229 08/18/2022: BUN 19; Creatinine, Ser 1.25; Potassium 3.4; Sodium 136  Recent Lipid Panel    Component Value Date/Time   CHOL 109 08/16/2022 0241   TRIG 112 08/16/2022 0241   HDL 33 (L) 08/16/2022 0241   CHOLHDL 3.3 08/16/2022 0241   VLDL 22 08/16/2022 0241   LDLCALC 54 08/16/2022 0241    Physical Exam:    VS:  BP 118/70   Pulse 70   Ht 5\' 3"  (1.6 m)   Wt 105 lb (47.6 kg)   SpO2 97%   BMI 18.60 kg/m     Wt Readings from Last 3 Encounters:  09/24/22 105 lb (47.6 kg)  08/16/22 104 lb 11.2 oz (47.5 kg)  08/09/22 97 lb 3.2 oz (44.1 kg)     GEN: Patient is in no acute distress HEENT: Normal NECK: No JVD; No carotid bruits LYMPHATICS: No lymphadenopathy CARDIAC: Hear sounds regular, 2/6 systolic murmur at the apex. RESPIRATORY:  Clear to auscultation without rales, wheezing or  rhonchi  ABDOMEN: Soft, non-tender, non-distended MUSCULOSKELETAL:  No edema; No deformity  SKIN: Warm and dry NEUROLOGIC:  Alert and oriented x 3 PSYCHIATRIC:  Normal affect   Signed, 08/11/22, MD  09/24/2022 9:19 AM    [facility]

## 2022-09-24 NOTE — Addendum Note (Signed)
Addended by: Truddie Hidden on: 09/24/2022 09:22 AM   Modules accepted: Orders

## 2022-09-24 NOTE — Patient Instructions (Signed)
Medication Instructions:  Your physician recommends that you continue on your current medications as directed. Please refer to the Current Medication list given to you today.  *If you need a refill on your cardiac medications before your next appointment, please call your pharmacy*   Lab Work: Your physician recommends that you return for lab work in: the next few days You need to have labs done when you are fasting.  You can come Monday through Friday 8:30 am to 12:00 pm and 1:15 to 4:30. You do not need to make an appointment as the order has already been placed. The labs you are going to have done areCMP and Lipids.  If you have labs (blood work) drawn today and your tests are completely normal, you will receive your results only by: [place] (if you have MyChart) OR A paper copy in the mail If you have any lab test that is abnormal or we need to change your treatment, we will call you to review the results.   Testing/Procedures: None ordered   Follow-Up: At [facility], you and your health needs are our priority.  As part of our continuing mission to provide you with exceptional heart care, we have created designated Provider Care Teams.  These Care Teams include your primary Cardiologist (physician) and Advanced Practice Providers (APPs -  Physician Assistants and Nurse Practitioners) who all work together to provide you with the care you need, when you need it.  We recommend signing up for the patient portal called "MyChart".  Sign up information is provided on this After Visit Summary.  MyChart is used to connect with patients for Virtual Visits (Telemedicine).  Patients are able to view lab/test results, encounter notes, upcoming appointments, etc.  Non-urgent messages can be sent to your provider as well.   To learn more about what you can do with MyChart, go to [email or web address].    Your next appointment:   4 month(s)  The format for your next  appointment:   In Person  Provider:   Jyl Heinz, MD    Other Instructions none  Important Information About Sugar

## 2022-10-01 ENCOUNTER — Other Ambulatory Visit: Payer: Self-pay

## 2023-01-23 DIAGNOSIS — E872 Acidosis, unspecified: Secondary | ICD-10-CM

## 2023-01-23 HISTORY — DX: Acidosis, unspecified: E87.20

## 2023-01-24 ENCOUNTER — Ambulatory Visit: Payer: Medicare Other | Admitting: Cardiology

## 2023-02-28 ENCOUNTER — Other Ambulatory Visit: Payer: Self-pay

## 2023-04-29 ENCOUNTER — Encounter: Payer: Self-pay | Admitting: Cardiology

## 2023-04-29 ENCOUNTER — Ambulatory Visit: Payer: Medicare (Managed Care) | Attending: Cardiology | Admitting: Cardiology

## 2023-04-29 VITALS — BP 124/76 | HR 60 | Ht 63.0 in | Wt 112.4 lb

## 2023-04-29 DIAGNOSIS — E782 Mixed hyperlipidemia: Secondary | ICD-10-CM | POA: Insufficient documentation

## 2023-04-29 DIAGNOSIS — I10 Essential (primary) hypertension: Secondary | ICD-10-CM | POA: Insufficient documentation

## 2023-04-29 DIAGNOSIS — F17210 Nicotine dependence, cigarettes, uncomplicated: Secondary | ICD-10-CM | POA: Diagnosis not present

## 2023-04-29 DIAGNOSIS — I2510 Atherosclerotic heart disease of native coronary artery without angina pectoris: Secondary | ICD-10-CM | POA: Insufficient documentation

## 2023-04-29 NOTE — Progress Notes (Signed)
Cardiology Office Note:    Date:  04/29/2023   ID:  KHERRINGTON [name], DOB 18-Jun-1956, MRN [id number]  PCP:  Care, [facility]  Cardiologist:  Garwin Brothers, MD   Referring MD: Care, [facility]    ASSESSMENT:    1. Coronary artery disease involving native coronary artery of native heart without angina pectoris   2. Essential hypertension   3. Mixed hyperlipidemia    PLAN:    In order of problems listed above:  Coronary artery disease: Secondary prevention stressed with the patient.  Importance of compliance with diet medication stressed and she vocalized understanding.  She was advised to ambulate to the best of her ability.  She mentions to me that is [facility] does her blood work. Essential hypertension: Blood pressure stable and diet was emphasized.  Lifestyle modification urged. Mixed dyslipidemia: On lipid-lowering medications followed by primary care.  She mentions to me that stable we will do her blood work and send me a copy. Cigarette smoker: I spent 5 minutes with the patient discussing solely about smoking. Smoking cessation was counseled. I suggested to the patient also different medications and pharmacological interventions. Patient is keen to try stopping on its own at this time. He will get back to me if he needs any further assistance in this matter. Patient will be seen in follow-up appointment in 6 months or earlier if the patient has any concerns.    Medication Adjustments/Labs and Tests Ordered: Current medicines are reviewed at length with the patient today.  Concerns regarding medicines are outlined above.  No orders of the defined types were placed in this encounter.  No orders of the defined types were placed in this encounter.    No chief complaint on file.    History of Present Illness:    Taylor Paul is a 67 y.o. female.  Patient has past medical history of coronary artery disease, essential hypertension, mixed dyslipidemia.  She denies  any problems at this time and takes care of activities of daily living.  Unfortunately she continues to smoke.  At the time of my evaluation, the patient is alert awake oriented and in no distress.  Past Medical History:  Diagnosis Date   Accelerating angina ([facility]) 08/09/2022   Acute gastric ulcer with perforation ([facility]) 06/05/2019   CAD in native artery 08/13/2022   Chest discomfort 12/23/2020   Congestive heart disease ([facility])    COPD (chronic obstructive pulmonary disease) ([facility])    Coronary artery disease 04/27/2022   Depressed left ventricular ejection fraction 02/18/2020   Encounter for screening colonoscopy 12/25/2019   Essential hypertension 01/18/2020   Hypertension    Metabolic acidosis 01/23/2023   Mixed hyperlipidemia 01/18/2020   Nonischemic cardiomyopathy (H[facility]) 10/13/2021   NSVT (nonsustained ventricular tachycardia) ([facility]) 03/19/2020   Other chest pain 01/18/2020   PAC (premature atrial contraction) 03/19/2020   Paroxysmal SVT (supraventricular tachycardia) 03/19/2020   Postoperative examination 06/05/2019   Preoperative clearance 01/18/2020   PVC (premature ventricular contraction) 01/18/2020   Shortness of breath 12/23/2020   Sinus pause 03/19/2020   Smoker 01/18/2020    Past Surgical History:  Procedure Laterality Date   APPENDECTOMY     CORONARY PRESSURE/FFR STUDY N/A 08/16/2022   Procedure: INTRAVASCULAR PRESSURE WIRE/FFR STUDY;  Surgeon: Yvonne Kendall, MD;  [facility];  Service: Cardiovascular;  Laterality: N/A;   CORONARY STENT INTERVENTION N/A 08/13/2022   Procedure: CORONARY STENT INTERVENTION;  Surgeon: Lyn Records, MD;  Location: [facility];  Service: Cardiovascular;  Laterality: N/A;   CORONARY STENT INTERVENTION N/A 08/16/2022   Procedure: CORONARY STENT INTERVENTION;  Surgeon: Yvonne Kendall, MD;  Location: [facility];  Service: Cardiovascular;  Laterality: N/A;   LEFT HEART CATH AND CORONARY ANGIOGRAPHY N/A  08/13/2022   Procedure: LEFT HEART CATH AND CORONARY ANGIOGRAPHY;  Surgeon: Lyn Records, MD;  Location: [facility];  Service: Cardiovascular;  Laterality: N/A;   STOMACH SURGERY     Perforated gastric ulcer    Current Medications: Current Meds  Medication Sig   acetaminophen (TYLENOL) 500 MG tablet Take 1,000 mg by mouth every 6 (six) hours as needed (Pain).   aspirin EC 81 MG tablet Take 81 mg by mouth daily.   atorvastatin (LIPITOR) 40 MG tablet Take 1 tablet (40 mg total) by mouth daily.   busPIRone (BUSPAR) 5 MG tablet Take 5 mg by mouth 2 (two) times daily.   calcium carbonate (OSCAL) 1500 (600 Ca) MG TABS tablet Take 1,500 mg by mouth 2 (two) times daily with a meal.   clopidogrel (PLAVIX) 75 MG tablet Take 1 tablet (75 mg total) by mouth daily with breakfast.   diclofenac Sodium (VOLTAREN) 1 % GEL Apply 2 g topically 4 (four) times daily. Apply to Neck   divalproex (DEPAKOTE) 500 MG DR tablet Take 500 mg by mouth daily.   DULoxetine (CYMBALTA) 60 MG capsule Take 60 mg by mouth daily.   fexofenadine (ALLEGRA) 60 MG tablet Take 60 mg by mouth 2 (two) times daily.   fluticasone (FLONASE) 50 MCG/ACT nasal spray Place 1 spray into both nostrils 2 (two) times daily as needed for rhinitis.   Fluticasone-Umeclidin-Vilant (TRELEGY ELLIPTA IN) Inhale 1 puff into the lungs daily.   guaiFENesin-dextromethorphan (ROBITUSSIN DM) 100-10 MG/5ML syrup Take 5 mLs by mouth every 4 (four) hours as needed for cough.   hydrocortisone cream 1 % Apply 1 Application topically daily as needed (Allergic reaction).   lubiprostone (AMITIZA) 24 MCG capsule Take 24 mcg by mouth 2 (two) times daily with a meal.   meclizine (ANTIVERT) 25 MG tablet Take 25 mg by mouth 3 (three) times daily.   metoCLOPramide (REGLAN) 5 MG tablet Take 5 mg by mouth 4 (four) times daily.   mirtazapine (REMERON) 7.5 MG tablet Take 7.5 mg by mouth at bedtime.   Multiple Vitamins-Minerals (PRESERVISION AREDS 2) CAPS Take 1  capsule by mouth 2 (two) times daily.   nicotine (NICODERM CQ - DOSED IN MG/24 HOURS) 14 mg/24hr patch Place 1 patch (14 mg total) onto the skin daily.   nitroGLYCERIN (NITROSTAT) 0.4 MG SL tablet DISSOLVE 1 TABLET UNDER TONGUE EVERY 5 MINUTES AS NEEDED FOR CHEST PAIN. IF NO RELIEF AFTER 3 DOSES CALL 911.   pantoprazole (PROTONIX) 40 MG tablet Take 40 mg by mouth daily.   potassium chloride (KLOR-CON) 20 MEQ packet Take 20 mEq by mouth 2 (two) times daily.   sucralfate (CARAFATE) 1 g tablet Take 1 g by mouth 3 (three) times daily before meals.     Allergies:   Amoxicillin and Tramadol   Social History   Socioeconomic History   Marital status: Single    Spouse name: Not on file   Number of children: Not on file   Years of education: Not on file   Highest education level: Not on file  Occupational History   Not on file  Tobacco Use   Smoking status: Every Day   Smokeless tobacco: Never  Substance and Sexual Activity   Alcohol  use: Never   Drug use: Never   Sexual activity: Not on file  Other Topics Concern   Not on file  Social History Narrative   Not on file   Social Determinants of Health   Financial Resource Strain: Not on file  Food Insecurity: Unknown (01/24/2023)   [facility]   Food vital sign    Within the past 12 months, you worried that your food would run out before you got money to buy more: Patient declined to answer    Within the past 12 months, the food you bought just didn't last and you didn't have money to get more. : Patient declined to answer  Transportation Needs: Not on file (01/24/2023)  Physical Activity: Not on file  Stress: Not on file  Social Connections: Not on file     Family History: The patient's family history includes Congestive Heart Failure in her father; Diabetes in her brother, father, and mother; Heart disease in her brother; Hypertension in her brother, father, and mother; Stroke in her brother.  ROS:    Please see the history of present illness.    All other systems reviewed and are negative.  EKGs/Labs/Other Studies Reviewed:    The following studies were reviewed today: I discussed my findings with the patient at length   Recent Labs: 08/17/2022: Hemoglobin 9.1; Platelets 229 08/18/2022: BUN 19; Creatinine, Ser 1.25; Potassium 3.4; Sodium 136  Recent Lipid Panel    Component Value Date/Time   CHOL 109 08/16/2022 0241   TRIG 112 08/16/2022 0241   HDL 33 (L) 08/16/2022 0241   CHOLHDL 3.3 08/16/2022 0241   VLDL 22 08/16/2022 0241   LDLCALC 54 08/16/2022 0241    Physical Exam:    VS:  BP 124/76   Pulse 60   Ht 5\' 3"  (1.6 m)   Wt 112 lb 6.4 oz (51 kg)   SpO2 96%   BMI 19.91 kg/m     Wt Readings from Last 3 Encounters:  04/29/23 112 lb 6.4 oz (51 kg)  09/24/22 105 lb (47.6 kg)  08/16/22 104 lb 11.2 oz (47.5 kg)     GEN: Patient is in no acute distress HEENT: Normal NECK: No JVD; No carotid bruits LYMPHATICS: No lymphadenopathy CARDIAC: Hear sounds regular, 2/6 systolic murmur at the apex. RESPIRATORY:  Clear to auscultation without rales, wheezing or rhonchi  ABDOMEN: Soft, non-tender, non-distended MUSCULOSKELETAL:  No edema; No deformity  SKIN: Warm and dry NEUROLOGIC:  Alert and oriented x 3 PSYCHIATRIC:  Normal affect   Signed, Garwin Brothers, MD  04/29/2023 11:16 AM    [facility]

## 2023-04-29 NOTE — Patient Instructions (Signed)

## 2023-06-24 DIAGNOSIS — I361 Nonrheumatic tricuspid (valve) insufficiency: Secondary | ICD-10-CM | POA: Diagnosis not present
